# Patient Record
Sex: Male | Born: 1955 | Race: Black or African American | Hispanic: No | Marital: Single | Smoking: Never smoker
Health system: Southern US, Community
[De-identification: ages and names within clinical notes are randomized; demographics above are authoritative.]

## PROBLEM LIST (undated history)

## (undated) DIAGNOSIS — I10 Essential (primary) hypertension: Secondary | ICD-10-CM

## (undated) DIAGNOSIS — E119 Type 2 diabetes mellitus without complications: Secondary | ICD-10-CM

## (undated) HISTORY — DX: Type 2 diabetes mellitus without complications: E11.9

## (undated) HISTORY — DX: Essential (primary) hypertension: I10

---

## 1997-10-28 ENCOUNTER — Emergency Department (HOSPITAL_COMMUNITY): Admission: EM | Admit: 1997-10-28 | Discharge: 1997-10-28 | Payer: Self-pay | Admitting: Emergency Medicine

## 1997-12-25 ENCOUNTER — Encounter: Payer: Self-pay | Admitting: Emergency Medicine

## 1997-12-25 ENCOUNTER — Emergency Department (HOSPITAL_COMMUNITY): Admission: EM | Admit: 1997-12-25 | Discharge: 1997-12-25 | Payer: Self-pay | Admitting: Emergency Medicine

## 2000-10-29 ENCOUNTER — Inpatient Hospital Stay (HOSPITAL_COMMUNITY): Admission: EM | Admit: 2000-10-29 | Discharge: 2000-11-27 | Payer: Self-pay | Admitting: *Deleted

## 2000-10-30 ENCOUNTER — Encounter: Payer: Self-pay | Admitting: Neurological Surgery

## 2000-10-30 ENCOUNTER — Encounter: Payer: Self-pay | Admitting: Neurosurgery

## 2006-01-28 ENCOUNTER — Ambulatory Visit: Payer: Self-pay | Admitting: Hospitalist

## 2006-01-28 ENCOUNTER — Inpatient Hospital Stay (HOSPITAL_COMMUNITY): Admission: EM | Admit: 2006-01-28 | Discharge: 2006-01-29 | Payer: Self-pay | Admitting: Emergency Medicine

## 2006-01-28 ENCOUNTER — Encounter (INDEPENDENT_AMBULATORY_CARE_PROVIDER_SITE_OTHER): Payer: Self-pay | Admitting: Cardiology

## 2006-04-01 ENCOUNTER — Ambulatory Visit: Payer: Self-pay | Admitting: Cardiology

## 2006-04-15 ENCOUNTER — Ambulatory Visit: Payer: Self-pay

## 2008-03-17 ENCOUNTER — Emergency Department (HOSPITAL_COMMUNITY): Admission: EM | Admit: 2008-03-17 | Discharge: 2008-03-17 | Payer: Self-pay | Admitting: Emergency Medicine
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
## 2010-07-13 NOTE — Discharge Summary (Signed)
NAME:  [facility], [facility] NO.:  [id number]   MEDICAL RECORD NO.:  [id number]          PATIENT TYPE:  INP   LOCATION:  3714                         FACILITY:  [facility]   PHYSICIAN:  Duncan Dull, M.D.     DATE OF BIRTH:  05/07/1955   DATE OF ADMISSION:  01/28/2006  DATE OF DISCHARGE:  01/29/2006                               DISCHARGE SUMMARY   DISCHARGE DIAGNOSES:  1. Atypical chest pain secondary to cocaine use.  2. Essential hypertension.  3. Prerenal renal insufficiency, resolved.  4. Poor dentition.   DISCHARGE MEDICATIONS:  1. Stop hydrochlorothiazide.  2. Stop lisinopril.  3. Norvasc 5 mg p.o. daily.   DISPOSITION AND FOLLOWUP:  Mr. Bekker was discharged in stable condition  after having been ruled out for MI.  He did not have chest pain or  shortness of breath on discharge.  He will follow up with the physician  at his assisted living facility and see Dr. Diona Browner from [facility] on February 12, 2006 at 3 p.m. in order to plan for a  Cardiolite testing.  Cause of anemia may be investigated as an  outpatient and follow up on chest x-ray in 2-3 months for right rib  calcification.   PROCEDURES:  1. Chest x-ray on January 28, 2006 showed a nodular density over the      first right rib on the frontal exam, likely to be an area of costal      cartilage calcification.  A followup plain film should be obtained      in 2-3 months to confirm stability.  An opacity was found at the      medial right lung base relating to atelectasis.  2. EKG:  Normal sinus rhythm without ST or T wave changes.   ADMITTING HISTORY AND PHYSICAL:  The patient is a 55 year old African  American man with past medical history of hypertension and a closed head  injury in September of 2002, leaving him with disability, who presents  to the emergency department with complaints of chest pain.  On morning  of admission, the patient had sudden onset of stabbing left lateral  chest  wall pain associated with nausea and shortness of breath.  He  denied any diaphoresis, syncope, blurry vision, vomiting and sense of  impending doom.  The nurse at his assisted living facility administered  325 mg of aspirin and activated EMS.  The patient received nitroglycerin  en route, after which the pain resolved.  This was the first episode of  chest pain for our patient.  He has no known history of coronary artery  disease.  He denied drug use and review of systems was insignificant.   PHYSICAL EXAMINATION:  VITAL SIGNS:  Temperature 98.8, blood pressure  102/48, heart rate 82, respiratory rate 10, O2 SAT 94% on room air.  GENERAL:  In no acute distress.  HEENT:  PERRLA.  EOMI.  Oropharynx clear.  Poor dentition, caries.  NECK:  Supple.  No lymphadenopathies.  No thyromegaly.  RESPIRATORY:  Lungs clear to auscultation bilaterally with good air  movement.  CARDIOVASCULAR:  Regular  rate and rhythm.  No murmurs, rubs, or gallops.  Peripheral pulses 2+ bilaterally.  ABDOMEN:  Soft, obese, nontender and non-distended.  No palpable masses  or organomegaly.  EXTREMITIES:  No edema, cyanosis or clubbing.  NEUROLOGIC:  Nonfocal.   LABORATORY DATA:  Sodium 140, potassium 3.8, chloride 104, CO2 24, BUN  22, creatinine 1.7, glucose 106.  WBC 6.2, hemoglobin 12.1, platelets  315,000, hematocrit 35.6, MCV 81.4.  Cardiac enzymes negative x2.  D-  dimer is negative.  Urinary drug screen positive for cocaine.  Fasting  lipid profile:  Total cholesterol 158, triglycerides 400, HDL 53, LDL  25.  TSH 1.11, free T4 0.837.   HOSPITAL COURSE:  PROBLEM #1 - CHEST PAIN:  Mr. Luten probability of  coronary artery disease is low to intermediate, considering his risk  factors of being a 55 year old man with hypertension.  He was ruled out  for acute coronary syndrome by serial EKGs and serial cardiac enzymes  that were negative.  Chest x-ray did not show any findings that could  explain his chest  pain.  However, his urinary drug screen was positive  for cocaine, so it was determined that his chest pain was most likely  cocaine-induced.  He did not have further episodes during hospital  course and remained hemodynamically stable.   PROBLEM #2 - HYPERTENSION:  His blood pressure remained controlled.  His  home regimen of hydrochlorothiazide and lisinopril was resumed; however,  it will not be continued on an outpatient basis.  We rather started him  on Norvasc 5 mg p.o. daily, since he does not require followup of  electrolytes and renal function.  A beta blocker could not be chosen,  since he uses cocaine.  He has no end-organ damage from his  hypertension.   PROBLEM #3 - RENAL INSUFFICIENCY:  The patient's creatinine on admission  was 1.7 and felt likely to be prerenal, since the patient had a history  of poor p.o. intake prior to admission; this was confirmed by his  creatinine normalizing on day #2 of his hospital course.   PROBLEM #4 - POOR DENTITION:  The patient has multiple caries.  He is  presently scheduled for dental surgery in the near future.   DISCHARGE VITALS:  Temperature 97.8, heart rate 68, blood pressure  145/85, O2 saturation 99% on room air.   DISCHARGE LABORATORY DATA:  WBC 5.8, hemoglobin 11.7, hematocrit 34.4,  MCV 80.8, RDW 13.5, platelet count 273,000.      Olene Craven, M.D.  Electronically Signed      Duncan Dull, M.D.  Electronically Signed    MC/MEDQ  D:  01/29/2006  T:  01/29/2006  Job:  [id number]
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
## 2011-06-26 DIAGNOSIS — C61 Malignant neoplasm of prostate: Secondary | ICD-10-CM | POA: Diagnosis not present

## 2011-06-26 DIAGNOSIS — R3915 Urgency of urination: Secondary | ICD-10-CM | POA: Diagnosis not present

## 2011-06-26 DIAGNOSIS — R300 Dysuria: Secondary | ICD-10-CM | POA: Diagnosis not present

## 2011-07-16 DIAGNOSIS — I10 Essential (primary) hypertension: Secondary | ICD-10-CM | POA: Diagnosis not present

## 2011-07-16 DIAGNOSIS — E785 Hyperlipidemia, unspecified: Secondary | ICD-10-CM | POA: Diagnosis not present

## 2011-07-16 DIAGNOSIS — D3701 Neoplasm of uncertain behavior of lip: Secondary | ICD-10-CM | POA: Diagnosis not present

## 2011-07-16 DIAGNOSIS — M25569 Pain in unspecified knee: Secondary | ICD-10-CM | POA: Diagnosis not present

## 2011-07-16 DIAGNOSIS — C61 Malignant neoplasm of prostate: Secondary | ICD-10-CM | POA: Diagnosis not present

## 2011-07-16 DIAGNOSIS — Z8546 Personal history of malignant neoplasm of prostate: Secondary | ICD-10-CM | POA: Diagnosis not present

## 2011-07-16 DIAGNOSIS — D126 Benign neoplasm of colon, unspecified: Secondary | ICD-10-CM | POA: Diagnosis not present

## 2011-07-16 DIAGNOSIS — K116 Mucocele of salivary gland: Secondary | ICD-10-CM | POA: Diagnosis not present

## 2011-07-31 DIAGNOSIS — D3701 Neoplasm of uncertain behavior of lip: Secondary | ICD-10-CM | POA: Diagnosis not present

## 2011-07-31 DIAGNOSIS — K1370 Unspecified lesions of oral mucosa: Secondary | ICD-10-CM | POA: Diagnosis not present

## 2011-07-31 DIAGNOSIS — D1800 Hemangioma unspecified site: Secondary | ICD-10-CM | POA: Diagnosis not present

## 2011-08-21 DIAGNOSIS — I10 Essential (primary) hypertension: Secondary | ICD-10-CM | POA: Diagnosis not present

## 2011-08-21 DIAGNOSIS — E785 Hyperlipidemia, unspecified: Secondary | ICD-10-CM | POA: Diagnosis not present

## 2011-08-21 DIAGNOSIS — M1710 Unilateral primary osteoarthritis, unspecified knee: Secondary | ICD-10-CM | POA: Diagnosis not present

## 2011-08-22 DIAGNOSIS — C61 Malignant neoplasm of prostate: Secondary | ICD-10-CM | POA: Diagnosis not present

## 2012-02-13 DIAGNOSIS — M1710 Unilateral primary osteoarthritis, unspecified knee: Secondary | ICD-10-CM | POA: Diagnosis not present

## 2012-02-13 DIAGNOSIS — E669 Obesity, unspecified: Secondary | ICD-10-CM | POA: Diagnosis not present

## 2012-02-13 DIAGNOSIS — Z0000 Encounter for general adult medical examination without abnormal findings: Secondary | ICD-10-CM | POA: Diagnosis not present

## 2012-02-13 DIAGNOSIS — E785 Hyperlipidemia, unspecified: Secondary | ICD-10-CM | POA: Diagnosis not present

## 2012-02-13 DIAGNOSIS — R319 Hematuria, unspecified: Secondary | ICD-10-CM | POA: Diagnosis not present

## 2012-02-13 DIAGNOSIS — C61 Malignant neoplasm of prostate: Secondary | ICD-10-CM | POA: Diagnosis not present

## 2012-02-13 DIAGNOSIS — Z23 Encounter for immunization: Secondary | ICD-10-CM | POA: Diagnosis not present

## 2012-02-13 DIAGNOSIS — I10 Essential (primary) hypertension: Secondary | ICD-10-CM | POA: Diagnosis not present

## 2012-02-13 DIAGNOSIS — D126 Benign neoplasm of colon, unspecified: Secondary | ICD-10-CM | POA: Diagnosis not present

## 2012-02-14 DIAGNOSIS — C61 Malignant neoplasm of prostate: Secondary | ICD-10-CM | POA: Diagnosis not present

## 2012-02-14 DIAGNOSIS — R310 Gross hematuria: Secondary | ICD-10-CM | POA: Diagnosis not present

## 2012-04-02 DIAGNOSIS — C61 Malignant neoplasm of prostate: Secondary | ICD-10-CM | POA: Diagnosis not present

## 2012-04-02 DIAGNOSIS — R310 Gross hematuria: Secondary | ICD-10-CM | POA: Diagnosis not present

## 2012-11-24 DIAGNOSIS — E785 Hyperlipidemia, unspecified: Secondary | ICD-10-CM | POA: Diagnosis not present

## 2012-11-24 DIAGNOSIS — I10 Essential (primary) hypertension: Secondary | ICD-10-CM | POA: Diagnosis not present

## 2012-11-24 DIAGNOSIS — R635 Abnormal weight gain: Secondary | ICD-10-CM | POA: Diagnosis not present

## 2012-11-24 DIAGNOSIS — M1710 Unilateral primary osteoarthritis, unspecified knee: Secondary | ICD-10-CM | POA: Diagnosis not present

## 2014-01-04 DIAGNOSIS — R7301 Impaired fasting glucose: Secondary | ICD-10-CM | POA: Diagnosis not present

## 2014-01-04 DIAGNOSIS — M25569 Pain in unspecified knee: Secondary | ICD-10-CM | POA: Diagnosis not present

## 2014-01-04 DIAGNOSIS — Z23 Encounter for immunization: Secondary | ICD-10-CM | POA: Diagnosis not present

## 2014-01-04 DIAGNOSIS — E785 Hyperlipidemia, unspecified: Secondary | ICD-10-CM | POA: Diagnosis not present

## 2014-01-04 DIAGNOSIS — I10 Essential (primary) hypertension: Secondary | ICD-10-CM | POA: Diagnosis not present

## 2014-02-08 DIAGNOSIS — M25561 Pain in right knee: Secondary | ICD-10-CM | POA: Diagnosis not present

## 2014-02-08 DIAGNOSIS — I10 Essential (primary) hypertension: Secondary | ICD-10-CM | POA: Diagnosis not present

## 2014-02-15 DIAGNOSIS — M25561 Pain in right knee: Secondary | ICD-10-CM | POA: Diagnosis not present

## 2014-10-10 DIAGNOSIS — I10 Essential (primary) hypertension: Secondary | ICD-10-CM | POA: Diagnosis not present

## 2014-10-10 DIAGNOSIS — R7301 Impaired fasting glucose: Secondary | ICD-10-CM | POA: Diagnosis not present

## 2014-10-10 DIAGNOSIS — E785 Hyperlipidemia, unspecified: Secondary | ICD-10-CM | POA: Diagnosis not present

## 2015-05-03 DIAGNOSIS — I10 Essential (primary) hypertension: Secondary | ICD-10-CM | POA: Diagnosis not present

## 2015-05-03 DIAGNOSIS — E785 Hyperlipidemia, unspecified: Secondary | ICD-10-CM | POA: Diagnosis not present

## 2015-05-03 DIAGNOSIS — R7301 Impaired fasting glucose: Secondary | ICD-10-CM | POA: Diagnosis not present

## 2015-05-17 DIAGNOSIS — R945 Abnormal results of liver function studies: Secondary | ICD-10-CM | POA: Diagnosis not present

## 2015-11-03 DIAGNOSIS — Z0000 Encounter for general adult medical examination without abnormal findings: Secondary | ICD-10-CM | POA: Diagnosis not present

## 2015-11-03 DIAGNOSIS — C61 Malignant neoplasm of prostate: Secondary | ICD-10-CM | POA: Diagnosis not present

## 2015-11-03 DIAGNOSIS — E785 Hyperlipidemia, unspecified: Secondary | ICD-10-CM | POA: Diagnosis not present

## 2015-11-03 DIAGNOSIS — Z23 Encounter for immunization: Secondary | ICD-10-CM | POA: Diagnosis not present

## 2015-11-03 DIAGNOSIS — R7301 Impaired fasting glucose: Secondary | ICD-10-CM | POA: Diagnosis not present

## 2015-11-03 DIAGNOSIS — I10 Essential (primary) hypertension: Secondary | ICD-10-CM | POA: Diagnosis not present

## 2015-11-15 DIAGNOSIS — E119 Type 2 diabetes mellitus without complications: Secondary | ICD-10-CM | POA: Diagnosis not present

## 2015-11-15 DIAGNOSIS — H25813 Combined forms of age-related cataract, bilateral: Secondary | ICD-10-CM | POA: Diagnosis not present

## 2015-11-15 DIAGNOSIS — H52223 Regular astigmatism, bilateral: Secondary | ICD-10-CM | POA: Diagnosis not present

## 2015-11-15 DIAGNOSIS — H35033 Hypertensive retinopathy, bilateral: Secondary | ICD-10-CM | POA: Diagnosis not present

## 2015-11-15 DIAGNOSIS — H524 Presbyopia: Secondary | ICD-10-CM | POA: Diagnosis not present

## 2015-11-15 DIAGNOSIS — H5203 Hypermetropia, bilateral: Secondary | ICD-10-CM | POA: Diagnosis not present

## 2015-11-22 DIAGNOSIS — Z8546 Personal history of malignant neoplasm of prostate: Secondary | ICD-10-CM | POA: Diagnosis not present

## 2015-11-22 DIAGNOSIS — C61 Malignant neoplasm of prostate: Secondary | ICD-10-CM | POA: Diagnosis not present

## 2015-12-20 DIAGNOSIS — Z86010 Personal history of colonic polyps: Secondary | ICD-10-CM | POA: Diagnosis not present

## 2015-12-20 DIAGNOSIS — K635 Polyp of colon: Secondary | ICD-10-CM | POA: Diagnosis not present

## 2015-12-20 DIAGNOSIS — D127 Benign neoplasm of rectosigmoid junction: Secondary | ICD-10-CM | POA: Diagnosis not present

## 2015-12-20 DIAGNOSIS — D125 Benign neoplasm of sigmoid colon: Secondary | ICD-10-CM | POA: Diagnosis not present

## 2015-12-20 DIAGNOSIS — K640 First degree hemorrhoids: Secondary | ICD-10-CM | POA: Diagnosis not present

## 2015-12-20 DIAGNOSIS — D126 Benign neoplasm of colon, unspecified: Secondary | ICD-10-CM | POA: Diagnosis not present

## 2015-12-20 DIAGNOSIS — K621 Rectal polyp: Secondary | ICD-10-CM | POA: Diagnosis not present

## 2015-12-26 DIAGNOSIS — D126 Benign neoplasm of colon, unspecified: Secondary | ICD-10-CM | POA: Diagnosis not present

## 2015-12-26 DIAGNOSIS — K635 Polyp of colon: Secondary | ICD-10-CM | POA: Diagnosis not present

## 2016-05-02 DIAGNOSIS — R7301 Impaired fasting glucose: Secondary | ICD-10-CM | POA: Diagnosis not present

## 2016-05-02 DIAGNOSIS — I10 Essential (primary) hypertension: Secondary | ICD-10-CM | POA: Diagnosis not present

## 2016-05-02 DIAGNOSIS — E7800 Pure hypercholesterolemia, unspecified: Secondary | ICD-10-CM | POA: Diagnosis not present

## 2016-11-01 DIAGNOSIS — E119 Type 2 diabetes mellitus without complications: Secondary | ICD-10-CM | POA: Diagnosis not present

## 2016-11-01 DIAGNOSIS — Z7984 Long term (current) use of oral hypoglycemic drugs: Secondary | ICD-10-CM | POA: Diagnosis not present

## 2016-11-01 DIAGNOSIS — E7800 Pure hypercholesterolemia, unspecified: Secondary | ICD-10-CM | POA: Diagnosis not present

## 2016-11-01 DIAGNOSIS — I10 Essential (primary) hypertension: Secondary | ICD-10-CM | POA: Diagnosis not present

## 2016-11-26 ENCOUNTER — Encounter: Payer: Self-pay | Admitting: Dietician

## 2016-11-26 ENCOUNTER — Encounter: Payer: Medicare Other | Attending: Physician Assistant | Admitting: Dietician

## 2016-11-26 DIAGNOSIS — E119 Type 2 diabetes mellitus without complications: Secondary | ICD-10-CM | POA: Insufficient documentation

## 2016-11-26 DIAGNOSIS — Z713 Dietary counseling and surveillance: Secondary | ICD-10-CM | POA: Diagnosis not present

## 2016-11-26 NOTE — Progress Notes (Signed)

## 2016-12-03 ENCOUNTER — Encounter: Payer: Medicare Other | Admitting: Dietician

## 2016-12-03 DIAGNOSIS — E119 Type 2 diabetes mellitus without complications: Secondary | ICD-10-CM

## 2016-12-03 DIAGNOSIS — Z713 Dietary counseling and surveillance: Secondary | ICD-10-CM | POA: Diagnosis not present

## 2016-12-05 NOTE — Progress Notes (Signed)
Patient was seen on 12/03/16 for the second of a series of three diabetes self-management courses at the [facility]. The following learning objectives were met by the patient during this class:   Describe the role of different macronutrients on glucose  Explain how carbohydrates affect blood glucose  State what foods contain the most carbohydrates  Demonstrate carbohydrate counting  Demonstrate how to read Nutrition Facts food label  Describe effects of various fats on heart health  Describe the importance of good nutrition for health and healthy eating strategies  Describe techniques for managing your shopping, cooking and meal planning  List strategies to follow meal plan when dining out  Describe the effects of alcohol on glucose and how to use it safely  Goals:  Follow Diabetes Meal Plan as instructed  Aim to spread carbs evenly throughout the day  Aim for 3 meals per day and snacks as needed Include lean protein foods to meals/snacks  Monitor glucose levels as instructed by your doctor   Follow-Up Plan:  Attend Core 3  Work towards following your personal food plan.   

## 2016-12-10 ENCOUNTER — Encounter: Payer: Medicare Other | Admitting: Dietician

## 2016-12-10 DIAGNOSIS — E119 Type 2 diabetes mellitus without complications: Secondary | ICD-10-CM | POA: Diagnosis not present

## 2016-12-10 DIAGNOSIS — Z713 Dietary counseling and surveillance: Secondary | ICD-10-CM | POA: Diagnosis not present

## 2016-12-10 NOTE — Progress Notes (Signed)
Patient was seen on 12/10/16 for the third of a series of three diabetes self-management courses at the [facility].   [facility] the amount of activity recommended for healthy living . Describe activities suitable for individual needs . Identify ways to regularly incorporate activity into daily life . Identify barriers to activity and ways to over come these barriers  Identify diabetes medications being personally used and their primary action for lowering glucose and possible side effects . Describe role of stress on blood glucose and develop strategies to address psychosocial issues . Identify diabetes complications and ways to prevent them  Explain how to manage diabetes during illness . Evaluate success in meeting personal goal . Establish 2-3 goals that they will plan to diligently work on until they return for the  26-month follow-up visit  Goals:   I will be active 30 minutes or more 3 times a week  I will eat less unhealthy fats by eating less chips  Your patient has identified these potential barriers to change:  Motivation    Plan:  Attend Support Group as desired

## 2016-12-31 DIAGNOSIS — R748 Abnormal levels of other serum enzymes: Secondary | ICD-10-CM | POA: Diagnosis not present

## 2016-12-31 DIAGNOSIS — E1165 Type 2 diabetes mellitus with hyperglycemia: Secondary | ICD-10-CM | POA: Diagnosis not present

## 2017-06-30 DIAGNOSIS — E119 Type 2 diabetes mellitus without complications: Secondary | ICD-10-CM | POA: Diagnosis not present

## 2017-06-30 DIAGNOSIS — E785 Hyperlipidemia, unspecified: Secondary | ICD-10-CM | POA: Diagnosis not present

## 2017-06-30 DIAGNOSIS — I10 Essential (primary) hypertension: Secondary | ICD-10-CM | POA: Diagnosis not present

## 2017-08-04 ENCOUNTER — Other Ambulatory Visit: Payer: Self-pay

## 2017-08-04 ENCOUNTER — Emergency Department (HOSPITAL_COMMUNITY): Payer: Medicare Other

## 2017-08-04 ENCOUNTER — Encounter (HOSPITAL_COMMUNITY): Payer: Self-pay | Admitting: Emergency Medicine

## 2017-08-04 ENCOUNTER — Emergency Department (HOSPITAL_COMMUNITY)
Admission: EM | Admit: 2017-08-04 | Discharge: 2017-08-04 | Disposition: A | Discharge status: Home / Self Care | Payer: Medicare Other | Attending: Emergency Medicine | Admitting: Emergency Medicine

## 2017-08-04 DIAGNOSIS — Y939 Activity, unspecified: Secondary | ICD-10-CM | POA: Insufficient documentation

## 2017-08-04 DIAGNOSIS — E119 Type 2 diabetes mellitus without complications: Secondary | ICD-10-CM | POA: Diagnosis not present

## 2017-08-04 DIAGNOSIS — S82832A Other fracture of upper and lower end of left fibula, initial encounter for closed fracture: Secondary | ICD-10-CM

## 2017-08-04 DIAGNOSIS — Z7984 Long term (current) use of oral hypoglycemic drugs: Secondary | ICD-10-CM | POA: Diagnosis not present

## 2017-08-04 DIAGNOSIS — W19XXXA Unspecified fall, initial encounter: Secondary | ICD-10-CM | POA: Diagnosis not present

## 2017-08-04 DIAGNOSIS — Y999 Unspecified external cause status: Secondary | ICD-10-CM | POA: Diagnosis not present

## 2017-08-04 DIAGNOSIS — Z79899 Other long term (current) drug therapy: Secondary | ICD-10-CM | POA: Insufficient documentation

## 2017-08-04 DIAGNOSIS — I10 Essential (primary) hypertension: Secondary | ICD-10-CM | POA: Insufficient documentation

## 2017-08-04 DIAGNOSIS — Y929 Unspecified place or not applicable: Secondary | ICD-10-CM | POA: Insufficient documentation

## 2017-08-04 DIAGNOSIS — S82432A Displaced oblique fracture of shaft of left fibula, initial encounter for closed fracture: Secondary | ICD-10-CM | POA: Diagnosis not present

## 2017-08-04 DIAGNOSIS — S99912A Unspecified injury of left ankle, initial encounter: Secondary | ICD-10-CM | POA: Diagnosis present

## 2017-08-04 MED ORDER — NAPROXEN 500 MG PO TABS: 500.0000 mg | ORAL_TABLET | Freq: Two times a day (BID) | ORAL | 0 refills | Status: DC | PRN

## 2017-08-04 NOTE — Discharge Instructions (Signed)
It was my pleasure taking care of you today!   Please call the orthopedic doctor listed today to schedule a follow up appointment.  Rest, ice and elevating the leg are very important. This will help with pain and swelling.   Naproxen twice daily as needed for pain. You can also alternate with Tylenol over-the-counter for additional pain relief.   Use crutches as needed for comfort. If you feel able to walk on the foot WITH THE BOOT ON, then that is fine. You must have boot on when you walk around.   Return to ER for new or worsening symptoms, any additional concerns.

## 2017-08-04 NOTE — ED Triage Notes (Signed)
Pt. Stated, I tripped and turned my left ankle. Swelling and painful

## 2017-08-04 NOTE — Progress Notes (Signed)
Orthopedic Tech Progress Note Patient Details:  Nathaniel [facility] Feb 01, 1956 [id number]  Ortho Devices Type of Ortho Device: Crutches, CAM walker Ortho Device/Splint Location: LLE Ortho Device/Splint Interventions: Ordered, Application, Adjustment   Post Interventions Patient Tolerated: Well Instructions Provided: Care of device   [name] 08/04/2017, 10:22 AM

## 2017-08-04 NOTE — ED Notes (Signed)
Pt stable, ambulatory with crutches and cam boot, and verbalizes understanding of d/c instructions.

## 2017-08-04 NOTE — ED Provider Notes (Signed)
[facility] EMERGENCY DEPARTMENT Provider Note   CSN: [id number] Arrival date & time: 08/04/17  0759     History   Chief Complaint Chief Complaint  Patient presents with  . Ankle Pain    HPI Nathaniel Burns is a 62 y.o. male.  The history is provided by the patient and medical records. No language interpreter was used.  Ankle Pain   Pertinent negatives include no numbness.   Nathaniel Burns is a 62 y.o. male  with a PMH of DM, HTN who presents to the Emergency Department complaining of persistent, constant left ankle pain since he fell 2 days ago.  Patient states that he was drunk and lost his balance.  He does not really remember what happened and unsure of the exact mechanism of the injury.  He has been able to ambulate on the foot, but this does exacerbate his pain.  He endorses associated swelling to ankle and foot which is been getting worse over the last 2 days.  He has been taking Tylenol at home with some relief.  Denies any numbness, tingling or weakness.  Past Medical History:  Diagnosis Date  . Diabetes mellitus without complication ([place])   . Hypertension     There are no active problems to display for this patient.   History reviewed. No pertinent surgical history.      Home Medications    Prior to Admission medications   Medication Sig Start Date End Date Taking? Authorizing Provider  amLODipine (NORVASC) 10 MG tablet Take 10 mg by mouth daily.    [provider]  atorvastatin (LIPITOR) 20 MG tablet Take 20 mg by mouth daily.    [provider]  lisinopril-hydrochlorothiazide (PRINZIDE,ZESTORETIC) 20-12.5 MG tablet Take 1 tablet by mouth daily.    [provider]  metFORMIN (GLUCOPHAGE) 500 MG tablet Take by mouth daily with breakfast.    [provider]  naproxen (NAPROSYN) 500 MG tablet Take 1 tablet (500 mg total) by mouth 2 (two) times daily as needed. 08/04/17   Ward, Ozella Almond, PA-C     Family History No family history on file.  Social History Social History   Tobacco Use  . Smoking status: Never Smoker  . Smokeless tobacco: Never Used  Substance Use Topics  . Alcohol use: Yes  . Drug use: Not Currently     Allergies   Patient has no allergy information on record.   Review of Systems Review of Systems  Musculoskeletal: Positive for arthralgias and myalgias.  Skin: Negative for color change and wound.  Neurological: Negative for weakness and numbness.     Physical Exam Updated Vital Signs BP 128/60 (BP Location: Left Arm)   Pulse 67   Temp 98.3 F (36.8 C) (Oral)   Resp 16   Ht 5\' 11"  (1.803 m)   Wt (!) 138.3 kg (305 lb)   SpO2 99%   BMI 42.54 kg/m   Physical Exam  Constitutional: He appears well-developed and well-nourished. No distress.  HENT:  Head: Normocephalic and atraumatic.  Neck: Neck supple.  Cardiovascular: Normal rate, regular rhythm and normal heart sounds.  No murmur heard. Pulmonary/Chest: Effort normal and breath sounds normal. No respiratory distress. He has no wheezes. He has no rales.  Musculoskeletal:  Tenderness to palpation of left lateral malleolus.  He has swelling to the forefoot, however no pain or tenderness to palpation. 2+ DP.  Sensation intact.  Good cap refill.  Full range of motion to foot/ankle/knee.  No tenderness to palpation of the knee.  Neurological: He is alert.  Skin: Skin is warm and dry.  Nursing note and vitals reviewed.    ED Treatments / Results  Labs (all labs ordered are listed, but only abnormal results are displayed) Labs Reviewed - No data to display  EKG None  Radiology Dg Ankle Complete Left  Result Date: 08/04/2017 CLINICAL DATA:  62 year old male with a history of fall and left ankle injury on Friday EXAM: LEFT ANKLE COMPLETE - 3+ VIEW COMPARISON:  None. FINDINGS: Acute oblique fracture at the distal left fibula, at the level of the syndesmosis and ankle mortise. Associated  soft tissue swelling and joint effusion. Ankle mortise appears congruent. Degenerative changes at the ankle. IMPRESSION: Acute oblique fibular fracture, as above. Associated soft tissue swelling and joint effusion. Electronically Signed   By: Corrie Mckusick D.O.   On: 08/04/2017 08:55    Procedures Procedures (including critical care time)  Medications Ordered in ED Medications - No data to display   Initial Impression / Assessment and Plan / ED Course  I have reviewed the triage vital signs and the nursing notes.  Pertinent labs & imaging results that were available during my care of the patient were reviewed by me and considered in my medical decision making (see chart for details).    Nathaniel Burns is a 62 y.o. male who presents to ED for evaluation of left lateral ankle pain after mechanical fall 2 days ago.  Neurovascularly intact on exam.  Tenderness to palpation of the ankle.  No tenderness to foot or knee.  X-ray reviewed showing distal fibula fracture.  No open wounds to suggest open fracture.  Placed in cam walker and will have him follow-up with orthopedics.  Symptomatic home care instructions and return precautions were discussed.  All questions answered.  Patient discussed with Dr. Lacinda Axon who agrees with treatment plan.    Final Clinical Impressions(s) / ED Diagnoses   Final diagnoses:  Closed fracture of distal end of left fibula, unspecified fracture morphology, initial encounter    ED Discharge Orders        Ordered    naproxen (NAPROSYN) 500 MG tablet  2 times daily PRN     08/04/17 0921       Ward, Ozella Almond, PA-C 08/04/17 3094    Nat Christen, MD 08/06/17 1034

## 2017-08-04 NOTE — ED Notes (Signed)
Pt has swelling to left ankle and foot- states he fell or twisted it on Friday- but "I was drunk and don't really remember" good cap refill, positive pedal pulse by doppler.

## 2017-08-04 NOTE — ED Notes (Signed)
Ortho tech at bedside 

## 2017-08-14 DIAGNOSIS — M25572 Pain in left ankle and joints of left foot: Secondary | ICD-10-CM | POA: Diagnosis not present

## 2017-08-25 DIAGNOSIS — M25572 Pain in left ankle and joints of left foot: Secondary | ICD-10-CM | POA: Diagnosis not present

## 2017-09-03 DIAGNOSIS — S82832D Other fracture of upper and lower end of left fibula, subsequent encounter for closed fracture with routine healing: Secondary | ICD-10-CM | POA: Diagnosis not present

## 2017-09-03 DIAGNOSIS — M25572 Pain in left ankle and joints of left foot: Secondary | ICD-10-CM | POA: Diagnosis not present

## 2017-10-01 DIAGNOSIS — S82832D Other fracture of upper and lower end of left fibula, subsequent encounter for closed fracture with routine healing: Secondary | ICD-10-CM | POA: Diagnosis not present

## 2017-10-01 DIAGNOSIS — M25572 Pain in left ankle and joints of left foot: Secondary | ICD-10-CM | POA: Diagnosis not present

## 2017-11-12 DIAGNOSIS — S82832D Other fracture of upper and lower end of left fibula, subsequent encounter for closed fracture with routine healing: Secondary | ICD-10-CM | POA: Diagnosis not present

## 2017-11-12 DIAGNOSIS — M25572 Pain in left ankle and joints of left foot: Secondary | ICD-10-CM | POA: Diagnosis not present

## 2017-12-30 DIAGNOSIS — E785 Hyperlipidemia, unspecified: Secondary | ICD-10-CM | POA: Diagnosis not present

## 2017-12-30 DIAGNOSIS — I10 Essential (primary) hypertension: Secondary | ICD-10-CM | POA: Diagnosis not present

## 2017-12-30 DIAGNOSIS — B351 Tinea unguium: Secondary | ICD-10-CM | POA: Diagnosis not present

## 2017-12-30 DIAGNOSIS — Z0000 Encounter for general adult medical examination without abnormal findings: Secondary | ICD-10-CM | POA: Diagnosis not present

## 2017-12-30 DIAGNOSIS — E1140 Type 2 diabetes mellitus with diabetic neuropathy, unspecified: Secondary | ICD-10-CM | POA: Diagnosis not present

## 2017-12-30 DIAGNOSIS — E1169 Type 2 diabetes mellitus with other specified complication: Secondary | ICD-10-CM | POA: Diagnosis not present

## 2017-12-30 DIAGNOSIS — Z8546 Personal history of malignant neoplasm of prostate: Secondary | ICD-10-CM | POA: Diagnosis not present

## 2018-07-01 DIAGNOSIS — I10 Essential (primary) hypertension: Secondary | ICD-10-CM | POA: Diagnosis not present

## 2018-07-01 DIAGNOSIS — E7800 Pure hypercholesterolemia, unspecified: Secondary | ICD-10-CM | POA: Diagnosis not present

## 2018-07-01 DIAGNOSIS — E1140 Type 2 diabetes mellitus with diabetic neuropathy, unspecified: Secondary | ICD-10-CM | POA: Diagnosis not present

## 2018-08-18 DIAGNOSIS — C61 Malignant neoplasm of prostate: Secondary | ICD-10-CM | POA: Diagnosis not present

## 2018-09-08 DIAGNOSIS — I10 Essential (primary) hypertension: Secondary | ICD-10-CM | POA: Diagnosis not present

## 2018-09-08 DIAGNOSIS — E7800 Pure hypercholesterolemia, unspecified: Secondary | ICD-10-CM | POA: Diagnosis not present

## 2018-09-08 DIAGNOSIS — E1140 Type 2 diabetes mellitus with diabetic neuropathy, unspecified: Secondary | ICD-10-CM | POA: Diagnosis not present

## 2019-01-09 ENCOUNTER — Other Ambulatory Visit: Payer: Self-pay

## 2019-01-09 DIAGNOSIS — Z20822 Contact with and (suspected) exposure to covid-19: Secondary | ICD-10-CM

## 2019-01-09 DIAGNOSIS — Z20828 Contact with and (suspected) exposure to other viral communicable diseases: Secondary | ICD-10-CM

## 2019-01-12 LAB — NOVEL CORONAVIRUS, NAA: SARS-CoV-2, NAA: NOT DETECTED

## 2019-01-18 DIAGNOSIS — I10 Essential (primary) hypertension: Secondary | ICD-10-CM | POA: Diagnosis not present

## 2019-01-18 DIAGNOSIS — E1140 Type 2 diabetes mellitus with diabetic neuropathy, unspecified: Secondary | ICD-10-CM | POA: Diagnosis not present

## 2019-01-18 DIAGNOSIS — Z0000 Encounter for general adult medical examination without abnormal findings: Secondary | ICD-10-CM | POA: Diagnosis not present

## 2019-01-18 DIAGNOSIS — Z125 Encounter for screening for malignant neoplasm of prostate: Secondary | ICD-10-CM | POA: Diagnosis not present

## 2019-01-18 DIAGNOSIS — Z131 Encounter for screening for diabetes mellitus: Secondary | ICD-10-CM | POA: Diagnosis not present

## 2019-01-18 DIAGNOSIS — Z1389 Encounter for screening for other disorder: Secondary | ICD-10-CM | POA: Diagnosis not present

## 2019-01-18 DIAGNOSIS — Z13220 Encounter for screening for lipoid disorders: Secondary | ICD-10-CM | POA: Diagnosis not present

## 2019-03-23 DIAGNOSIS — E1140 Type 2 diabetes mellitus with diabetic neuropathy, unspecified: Secondary | ICD-10-CM | POA: Diagnosis not present

## 2019-03-23 DIAGNOSIS — Z136 Encounter for screening for cardiovascular disorders: Secondary | ICD-10-CM | POA: Diagnosis not present

## 2019-03-23 DIAGNOSIS — Z13220 Encounter for screening for lipoid disorders: Secondary | ICD-10-CM | POA: Diagnosis not present

## 2019-03-23 DIAGNOSIS — Z125 Encounter for screening for malignant neoplasm of prostate: Secondary | ICD-10-CM | POA: Diagnosis not present

## 2019-03-23 DIAGNOSIS — I10 Essential (primary) hypertension: Secondary | ICD-10-CM | POA: Diagnosis not present

## 2019-06-23 DIAGNOSIS — M25571 Pain in right ankle and joints of right foot: Secondary | ICD-10-CM | POA: Diagnosis not present

## 2019-06-25 DIAGNOSIS — M25571 Pain in right ankle and joints of right foot: Secondary | ICD-10-CM | POA: Diagnosis not present

## 2019-08-04 DIAGNOSIS — I10 Essential (primary) hypertension: Secondary | ICD-10-CM | POA: Diagnosis not present

## 2019-08-04 DIAGNOSIS — E1169 Type 2 diabetes mellitus with other specified complication: Secondary | ICD-10-CM | POA: Diagnosis not present

## 2019-08-04 DIAGNOSIS — E785 Hyperlipidemia, unspecified: Secondary | ICD-10-CM | POA: Diagnosis not present

## 2019-08-13 ENCOUNTER — Other Ambulatory Visit: Payer: Self-pay | Admitting: Physician Assistant

## 2019-08-13 DIAGNOSIS — R748 Abnormal levels of other serum enzymes: Secondary | ICD-10-CM

## 2019-08-18 ENCOUNTER — Ambulatory Visit
Admission: RE | Admit: 2019-08-18 | Discharge: 2019-08-18 | Disposition: A | Discharge status: Home / Self Care | Payer: Medicare Other | Source: Ambulatory Visit | Attending: Physician Assistant | Admitting: Physician Assistant

## 2019-08-18 DIAGNOSIS — R748 Abnormal levels of other serum enzymes: Secondary | ICD-10-CM

## 2019-09-15 IMAGING — CR DG ANKLE COMPLETE 3+V*L*
3 series · 3 of 3 positions shown · non-contrast
Comparison: None.

CLINICAL DATA: 61-year-old male with a history of fall and left
ankle injury on [REDACTED]

EXAM:
LEFT ANKLE COMPLETE - 3+ VIEW

[ankle ap]
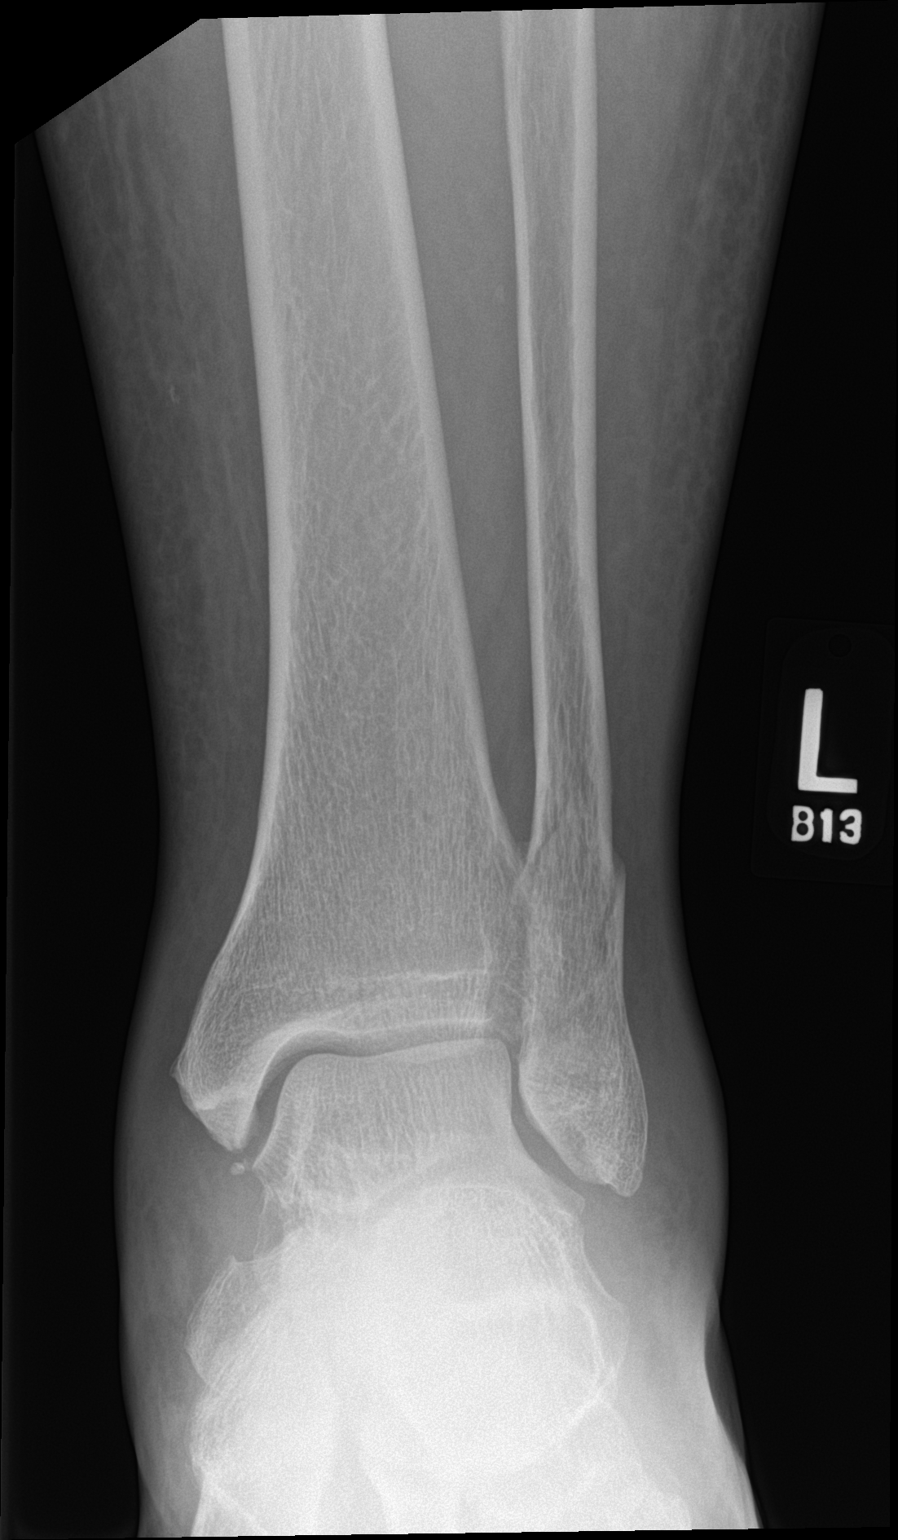

[ankle obl]
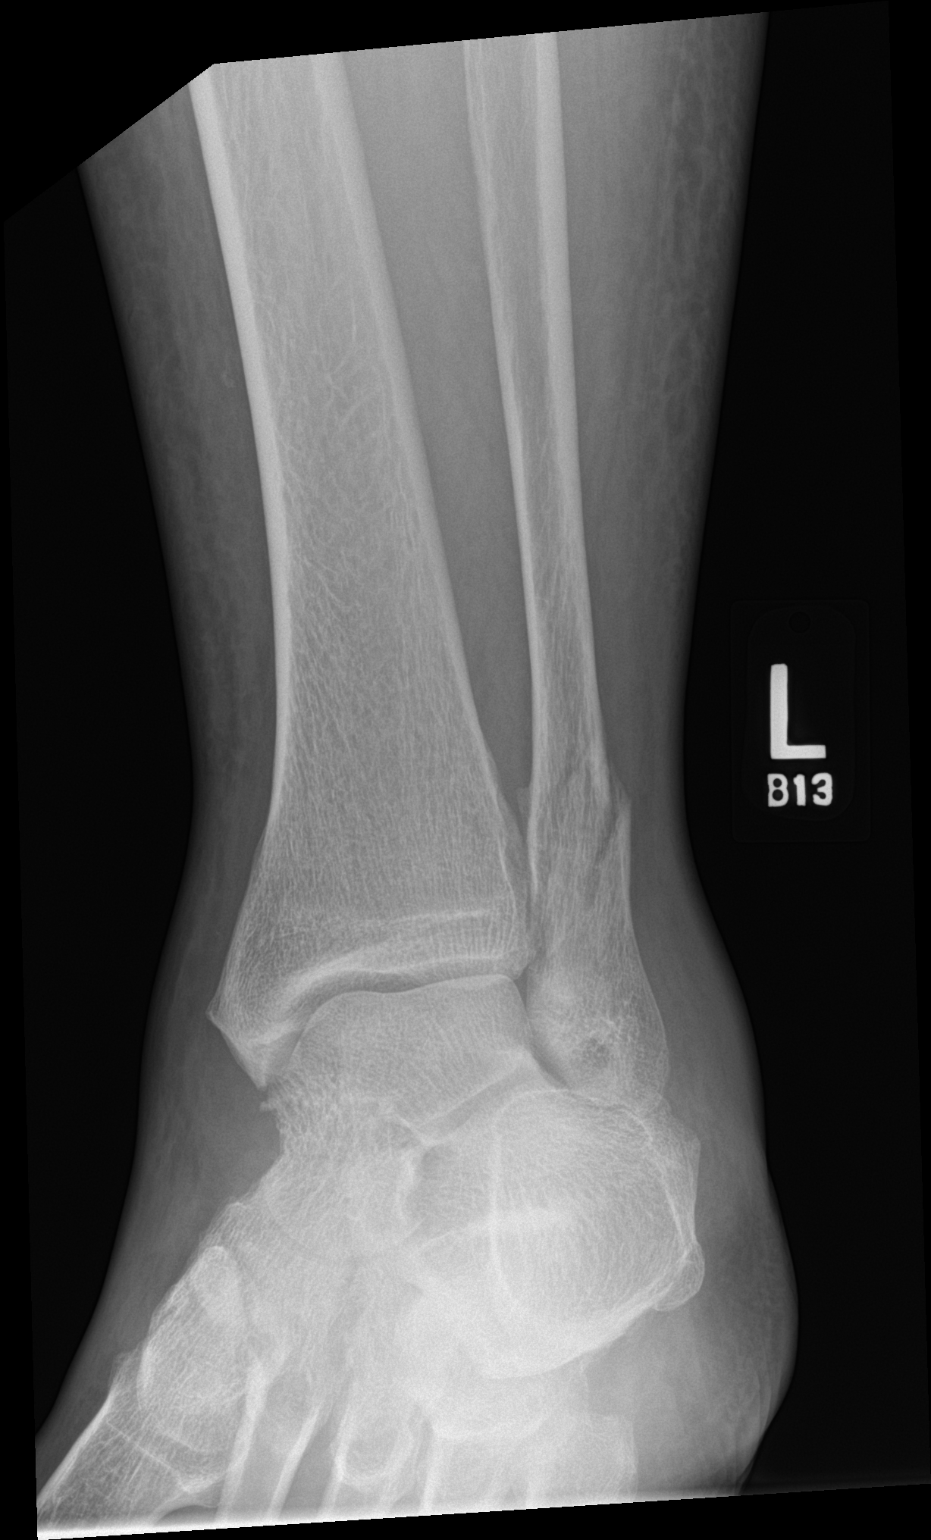

[ankle lat]
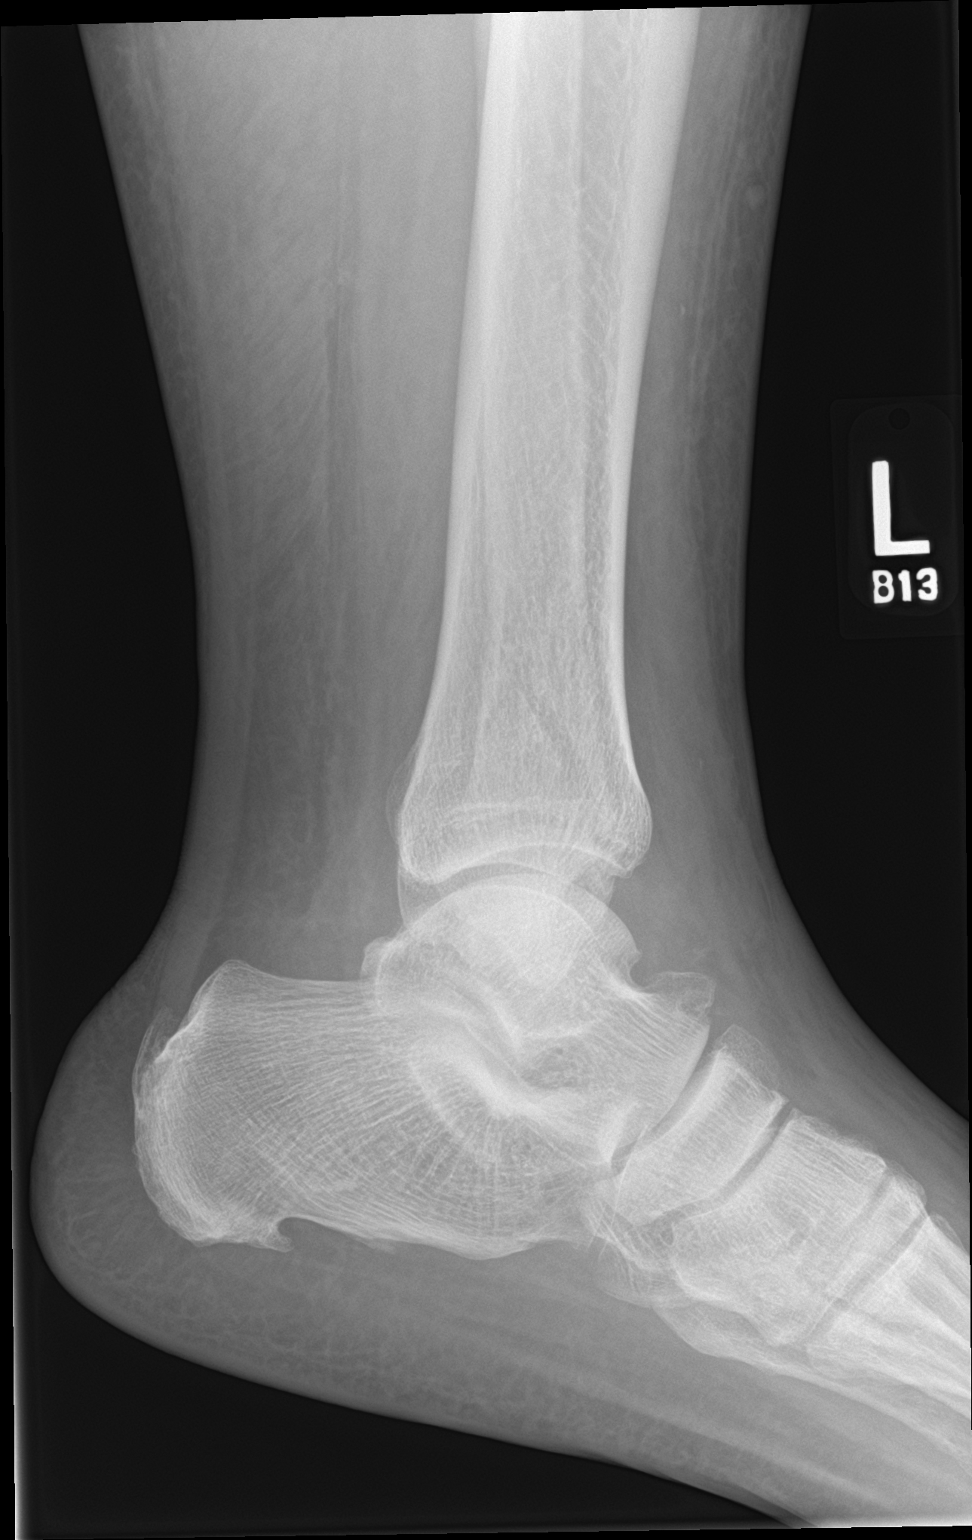

[3 of 3 positions shown; findings below may reference images not displayed]

FINDINGS: Acute oblique fracture at the distal left fibula, at the level of
the syndesmosis and ankle mortise. Associated soft tissue swelling
and joint effusion. Ankle mortise appears congruent.

Degenerative changes at the ankle.
IMPRESSION: Acute oblique fibular fracture, as above. Associated soft tissue
swelling and joint effusion.

## 2020-02-03 DIAGNOSIS — I10 Essential (primary) hypertension: Secondary | ICD-10-CM | POA: Diagnosis not present

## 2020-02-03 DIAGNOSIS — H35033 Hypertensive retinopathy, bilateral: Secondary | ICD-10-CM | POA: Diagnosis not present

## 2020-02-03 DIAGNOSIS — E785 Hyperlipidemia, unspecified: Secondary | ICD-10-CM | POA: Diagnosis not present

## 2020-02-03 DIAGNOSIS — C61 Malignant neoplasm of prostate: Secondary | ICD-10-CM | POA: Diagnosis not present

## 2020-02-03 DIAGNOSIS — K760 Fatty (change of) liver, not elsewhere classified: Secondary | ICD-10-CM | POA: Diagnosis not present

## 2020-02-03 DIAGNOSIS — E1140 Type 2 diabetes mellitus with diabetic neuropathy, unspecified: Secondary | ICD-10-CM | POA: Diagnosis not present

## 2020-02-03 DIAGNOSIS — Z23 Encounter for immunization: Secondary | ICD-10-CM | POA: Diagnosis not present

## 2020-02-03 DIAGNOSIS — Z0000 Encounter for general adult medical examination without abnormal findings: Secondary | ICD-10-CM | POA: Diagnosis not present

## 2020-02-16 DIAGNOSIS — R768 Other specified abnormal immunological findings in serum: Secondary | ICD-10-CM | POA: Diagnosis not present

## 2020-03-10 DIAGNOSIS — R748 Abnormal levels of other serum enzymes: Secondary | ICD-10-CM | POA: Diagnosis not present

## 2020-03-10 DIAGNOSIS — K760 Fatty (change of) liver, not elsewhere classified: Secondary | ICD-10-CM | POA: Diagnosis not present

## 2020-03-10 DIAGNOSIS — B182 Chronic viral hepatitis C: Secondary | ICD-10-CM | POA: Diagnosis not present

## 2020-03-24 ENCOUNTER — Other Ambulatory Visit: Payer: Self-pay | Admitting: Nurse Practitioner

## 2020-03-24 DIAGNOSIS — K7469 Other cirrhosis of liver: Secondary | ICD-10-CM

## 2020-03-24 DIAGNOSIS — R772 Abnormality of alphafetoprotein: Secondary | ICD-10-CM

## 2020-04-19 ENCOUNTER — Ambulatory Visit
Admission: RE | Admit: 2020-04-19 | Discharge: 2020-04-19 | Disposition: A | Discharge status: Home / Self Care | Payer: Medicare Other | Source: Ambulatory Visit | Attending: Nurse Practitioner | Admitting: Nurse Practitioner

## 2020-04-19 DIAGNOSIS — K8020 Calculus of gallbladder without cholecystitis without obstruction: Secondary | ICD-10-CM | POA: Diagnosis not present

## 2020-04-19 DIAGNOSIS — R160 Hepatomegaly, not elsewhere classified: Secondary | ICD-10-CM | POA: Diagnosis not present

## 2020-04-19 DIAGNOSIS — R7989 Other specified abnormal findings of blood chemistry: Secondary | ICD-10-CM | POA: Diagnosis not present

## 2020-04-19 DIAGNOSIS — R935 Abnormal findings on diagnostic imaging of other abdominal regions, including retroperitoneum: Secondary | ICD-10-CM | POA: Diagnosis not present

## 2020-04-19 DIAGNOSIS — K7469 Other cirrhosis of liver: Secondary | ICD-10-CM

## 2020-04-19 DIAGNOSIS — R772 Abnormality of alphafetoprotein: Secondary | ICD-10-CM

## 2020-04-19 MED ORDER — GADOXETATE DISODIUM 0.25 MMOL/ML IV SOLN
10.0000 mL | Freq: Once | INTRAVENOUS | Status: AC | PRN
  Administered 2020-04-19: 10 mL via INTRAVENOUS

## 2020-05-04 DIAGNOSIS — K7402 Hepatic fibrosis, advanced fibrosis: Secondary | ICD-10-CM | POA: Diagnosis not present

## 2020-05-04 DIAGNOSIS — B182 Chronic viral hepatitis C: Secondary | ICD-10-CM | POA: Diagnosis not present

## 2020-05-04 DIAGNOSIS — R772 Abnormality of alphafetoprotein: Secondary | ICD-10-CM | POA: Diagnosis not present

## 2020-05-22 DIAGNOSIS — S9031XA Contusion of right foot, initial encounter: Secondary | ICD-10-CM | POA: Diagnosis not present

## 2020-06-06 DIAGNOSIS — B182 Chronic viral hepatitis C: Secondary | ICD-10-CM | POA: Diagnosis not present

## 2020-06-06 DIAGNOSIS — K760 Fatty (change of) liver, not elsewhere classified: Secondary | ICD-10-CM | POA: Diagnosis not present

## 2020-08-01 DIAGNOSIS — K760 Fatty (change of) liver, not elsewhere classified: Secondary | ICD-10-CM | POA: Diagnosis not present

## 2020-08-01 DIAGNOSIS — B182 Chronic viral hepatitis C: Secondary | ICD-10-CM | POA: Diagnosis not present

## 2020-08-03 DIAGNOSIS — E1169 Type 2 diabetes mellitus with other specified complication: Secondary | ICD-10-CM | POA: Diagnosis not present

## 2020-08-03 DIAGNOSIS — I10 Essential (primary) hypertension: Secondary | ICD-10-CM | POA: Diagnosis not present

## 2020-08-03 DIAGNOSIS — E7800 Pure hypercholesterolemia, unspecified: Secondary | ICD-10-CM | POA: Diagnosis not present

## 2020-08-03 DIAGNOSIS — B182 Chronic viral hepatitis C: Secondary | ICD-10-CM | POA: Diagnosis not present

## 2020-08-17 DIAGNOSIS — R772 Abnormality of alphafetoprotein: Secondary | ICD-10-CM | POA: Diagnosis not present

## 2020-08-17 DIAGNOSIS — B182 Chronic viral hepatitis C: Secondary | ICD-10-CM | POA: Diagnosis not present

## 2020-08-17 DIAGNOSIS — K7402 Hepatic fibrosis, advanced fibrosis: Secondary | ICD-10-CM | POA: Diagnosis not present

## 2020-09-18 DIAGNOSIS — C61 Malignant neoplasm of prostate: Secondary | ICD-10-CM | POA: Diagnosis not present

## 2020-11-14 DIAGNOSIS — R760 Raised antibody titer: Secondary | ICD-10-CM | POA: Diagnosis not present

## 2020-11-14 DIAGNOSIS — B182 Chronic viral hepatitis C: Secondary | ICD-10-CM | POA: Diagnosis not present

## 2020-12-06 DIAGNOSIS — R772 Abnormality of alphafetoprotein: Secondary | ICD-10-CM | POA: Diagnosis not present

## 2020-12-06 DIAGNOSIS — B182 Chronic viral hepatitis C: Secondary | ICD-10-CM | POA: Diagnosis not present

## 2020-12-06 DIAGNOSIS — K7402 Hepatic fibrosis, advanced fibrosis: Secondary | ICD-10-CM | POA: Diagnosis not present

## 2020-12-06 DIAGNOSIS — E785 Hyperlipidemia, unspecified: Secondary | ICD-10-CM | POA: Diagnosis not present

## 2020-12-07 ENCOUNTER — Other Ambulatory Visit: Payer: Self-pay | Admitting: Nurse Practitioner

## 2020-12-07 DIAGNOSIS — B182 Chronic viral hepatitis C: Secondary | ICD-10-CM

## 2020-12-07 DIAGNOSIS — R772 Abnormality of alphafetoprotein: Secondary | ICD-10-CM

## 2020-12-07 DIAGNOSIS — K7402 Hepatic fibrosis, advanced fibrosis: Secondary | ICD-10-CM

## 2021-01-02 ENCOUNTER — Ambulatory Visit
Admission: RE | Admit: 2021-01-02 | Discharge: 2021-01-02 | Disposition: A | Discharge status: Home / Self Care | Payer: Medicare Other | Source: Ambulatory Visit | Attending: Nurse Practitioner | Admitting: Nurse Practitioner

## 2021-01-02 DIAGNOSIS — K7402 Hepatic fibrosis, advanced fibrosis: Secondary | ICD-10-CM

## 2021-01-02 DIAGNOSIS — B182 Chronic viral hepatitis C: Secondary | ICD-10-CM

## 2021-01-02 DIAGNOSIS — R772 Abnormality of alphafetoprotein: Secondary | ICD-10-CM

## 2021-01-02 DIAGNOSIS — K760 Fatty (change of) liver, not elsewhere classified: Secondary | ICD-10-CM | POA: Diagnosis not present

## 2021-01-02 DIAGNOSIS — K8020 Calculus of gallbladder without cholecystitis without obstruction: Secondary | ICD-10-CM | POA: Diagnosis not present

## 2021-02-06 DIAGNOSIS — E1165 Type 2 diabetes mellitus with hyperglycemia: Secondary | ICD-10-CM | POA: Diagnosis not present

## 2021-02-06 DIAGNOSIS — C61 Malignant neoplasm of prostate: Secondary | ICD-10-CM | POA: Diagnosis not present

## 2021-02-06 DIAGNOSIS — B182 Chronic viral hepatitis C: Secondary | ICD-10-CM | POA: Diagnosis not present

## 2021-02-06 DIAGNOSIS — E785 Hyperlipidemia, unspecified: Secondary | ICD-10-CM | POA: Diagnosis not present

## 2021-02-06 DIAGNOSIS — Z1211 Encounter for screening for malignant neoplasm of colon: Secondary | ICD-10-CM | POA: Diagnosis not present

## 2021-02-06 DIAGNOSIS — K760 Fatty (change of) liver, not elsewhere classified: Secondary | ICD-10-CM | POA: Diagnosis not present

## 2021-02-06 DIAGNOSIS — I10 Essential (primary) hypertension: Secondary | ICD-10-CM | POA: Diagnosis not present

## 2021-02-06 DIAGNOSIS — Z23 Encounter for immunization: Secondary | ICD-10-CM | POA: Diagnosis not present

## 2021-02-06 DIAGNOSIS — E7800 Pure hypercholesterolemia, unspecified: Secondary | ICD-10-CM | POA: Diagnosis not present

## 2021-02-06 DIAGNOSIS — Z0000 Encounter for general adult medical examination without abnormal findings: Secondary | ICD-10-CM | POA: Diagnosis not present

## 2021-03-30 DIAGNOSIS — K7030 Alcoholic cirrhosis of liver without ascites: Secondary | ICD-10-CM | POA: Diagnosis not present

## 2021-03-30 DIAGNOSIS — Z86010 Personal history of colonic polyps: Secondary | ICD-10-CM | POA: Diagnosis not present

## 2021-05-22 ENCOUNTER — Other Ambulatory Visit: Payer: Self-pay | Admitting: Gastroenterology

## 2021-06-11 ENCOUNTER — Other Ambulatory Visit: Payer: Self-pay | Admitting: Nurse Practitioner

## 2021-06-11 DIAGNOSIS — K7469 Other cirrhosis of liver: Secondary | ICD-10-CM

## 2021-06-11 DIAGNOSIS — R772 Abnormality of alphafetoprotein: Secondary | ICD-10-CM | POA: Diagnosis not present

## 2021-06-11 DIAGNOSIS — M79672 Pain in left foot: Secondary | ICD-10-CM | POA: Diagnosis not present

## 2021-06-14 DIAGNOSIS — M79672 Pain in left foot: Secondary | ICD-10-CM | POA: Diagnosis not present

## 2021-06-14 DIAGNOSIS — M25572 Pain in left ankle and joints of left foot: Secondary | ICD-10-CM | POA: Diagnosis not present

## 2021-06-19 ENCOUNTER — Ambulatory Visit
Admission: RE | Admit: 2021-06-19 | Discharge: 2021-06-19 | Disposition: A | Discharge status: Home / Self Care | Payer: Medicare Other | Source: Ambulatory Visit | Attending: Nurse Practitioner | Admitting: Nurse Practitioner

## 2021-06-19 DIAGNOSIS — K7460 Unspecified cirrhosis of liver: Secondary | ICD-10-CM | POA: Diagnosis not present

## 2021-06-19 DIAGNOSIS — K8020 Calculus of gallbladder without cholecystitis without obstruction: Secondary | ICD-10-CM | POA: Diagnosis not present

## 2021-06-19 DIAGNOSIS — R772 Abnormality of alphafetoprotein: Secondary | ICD-10-CM

## 2021-06-19 DIAGNOSIS — K7469 Other cirrhosis of liver: Secondary | ICD-10-CM

## 2021-07-05 DIAGNOSIS — M2142 Flat foot [pes planus] (acquired), left foot: Secondary | ICD-10-CM | POA: Diagnosis not present

## 2021-07-05 DIAGNOSIS — R29898 Other symptoms and signs involving the musculoskeletal system: Secondary | ICD-10-CM | POA: Diagnosis not present

## 2021-07-05 DIAGNOSIS — M722 Plantar fascial fibromatosis: Secondary | ICD-10-CM | POA: Diagnosis not present

## 2021-08-07 DIAGNOSIS — E785 Hyperlipidemia, unspecified: Secondary | ICD-10-CM | POA: Diagnosis not present

## 2021-08-07 DIAGNOSIS — I10 Essential (primary) hypertension: Secondary | ICD-10-CM | POA: Diagnosis not present

## 2021-08-07 DIAGNOSIS — L602 Onychogryphosis: Secondary | ICD-10-CM | POA: Diagnosis not present

## 2021-08-07 DIAGNOSIS — E1169 Type 2 diabetes mellitus with other specified complication: Secondary | ICD-10-CM | POA: Diagnosis not present

## 2021-09-06 ENCOUNTER — Ambulatory Visit (INDEPENDENT_AMBULATORY_CARE_PROVIDER_SITE_OTHER): Payer: Medicare Other | Admitting: Podiatry

## 2021-09-06 DIAGNOSIS — B353 Tinea pedis: Secondary | ICD-10-CM | POA: Diagnosis not present

## 2021-09-06 DIAGNOSIS — B351 Tinea unguium: Secondary | ICD-10-CM | POA: Diagnosis not present

## 2021-09-06 DIAGNOSIS — M79674 Pain in right toe(s): Secondary | ICD-10-CM

## 2021-09-06 DIAGNOSIS — M79675 Pain in left toe(s): Secondary | ICD-10-CM

## 2021-09-06 MED ORDER — KETOCONAZOLE 2 % EX CREA: 1.0000 | TOPICAL_CREAM | Freq: Two times a day (BID) | CUTANEOUS | 2 refills | Status: AC

## 2021-09-06 NOTE — Progress Notes (Signed)
  Subjective:  Patient ID: Nathaniel Burns, male    DOB: Oct 04, 1955,  MRN: [id number]  Chief Complaint  Patient presents with   Callouses    Calluses and toe nail trim     66 y.o. male presents with the above complaint. History confirmed with patient.  His nails are the primary issue and they are thickened and elongated.  Also has quite dry skin on both feet.  He states he is nondiabetic and does not have medication for this currently  Objective:  Physical Exam: warm, good capillary refill, no trophic changes or ulcerative lesions, normal DP and PT pulses, normal sensory exam, and tinea pedis. Left Foot: dystrophic yellowed discolored nail plates with subungual debris Right Foot: dystrophic yellowed discolored nail plates with subungual debris  Assessment:   1. Pain due to onychomycosis of toenails of both feet   2. Tinea pedis of both feet      Plan:  Patient was evaluated and treated and all questions answered.  Discussed the etiology and treatment options for the condition in detail with the patient. Educated patient on the topical and oral treatment options for mycotic nails. Recommended debridement of the nails today. Sharp and mechanical debridement performed of all painful and mycotic nails today. Nails debrided in length and thickness using a nail nipper to level of comfort. Discussed treatment options including appropriate shoe gear. Follow up as needed for painful nails.  Discussed the etiology and treatment options for tinea pedis.  Discussed topical and oral treatment.  Recommended topical treatment with 2% ketoconazole cream.  This was sent to the patient's pharmacy.  Also discussed appropriate foot hygiene, use of antifungal spray such as Tinactin in shoes, as well as cleaning her foot surfaces such as showers and bathroom floors with bleach.   Return in about 3 months (around 12/07/2021) for thick painful nails.

## 2021-09-13 ENCOUNTER — Encounter (HOSPITAL_COMMUNITY): Payer: Self-pay | Admitting: Gastroenterology

## 2021-09-20 ENCOUNTER — Other Ambulatory Visit: Payer: Self-pay

## 2021-09-20 ENCOUNTER — Ambulatory Visit (HOSPITAL_BASED_OUTPATIENT_CLINIC_OR_DEPARTMENT_OTHER): Payer: Medicare Other | Admitting: Anesthesiology

## 2021-09-20 ENCOUNTER — Ambulatory Visit (HOSPITAL_COMMUNITY): Payer: Medicare Other | Admitting: Anesthesiology

## 2021-09-20 ENCOUNTER — Ambulatory Visit (HOSPITAL_COMMUNITY)
Admission: RE | Admit: 2021-09-20 | Discharge: 2021-09-20 | Disposition: A | Discharge status: Home / Self Care | Payer: Medicare Other | Attending: Gastroenterology | Admitting: Gastroenterology

## 2021-09-20 ENCOUNTER — Encounter (HOSPITAL_COMMUNITY)
Admission: RE | Disposition: A | Discharge status: Home / Self Care | Payer: Self-pay | Source: Home / Self Care | Attending: Gastroenterology

## 2021-09-20 DIAGNOSIS — D124 Benign neoplasm of descending colon: Secondary | ICD-10-CM | POA: Diagnosis not present

## 2021-09-20 DIAGNOSIS — Z86010 Personal history of colonic polyps: Secondary | ICD-10-CM | POA: Diagnosis not present

## 2021-09-20 DIAGNOSIS — Z860101 Personal history of adenomatous and serrated colon polyps: Secondary | ICD-10-CM

## 2021-09-20 DIAGNOSIS — E119 Type 2 diabetes mellitus without complications: Secondary | ICD-10-CM | POA: Insufficient documentation

## 2021-09-20 DIAGNOSIS — K7460 Unspecified cirrhosis of liver: Secondary | ICD-10-CM | POA: Diagnosis present

## 2021-09-20 DIAGNOSIS — D122 Benign neoplasm of ascending colon: Secondary | ICD-10-CM | POA: Insufficient documentation

## 2021-09-20 DIAGNOSIS — Z1211 Encounter for screening for malignant neoplasm of colon: Secondary | ICD-10-CM | POA: Diagnosis not present

## 2021-09-20 DIAGNOSIS — Z6841 Body Mass Index (BMI) 40.0 and over, adult: Secondary | ICD-10-CM | POA: Insufficient documentation

## 2021-09-20 DIAGNOSIS — I10 Essential (primary) hypertension: Secondary | ICD-10-CM | POA: Insufficient documentation

## 2021-09-20 DIAGNOSIS — K2970 Gastritis, unspecified, without bleeding: Secondary | ICD-10-CM

## 2021-09-20 DIAGNOSIS — K640 First degree hemorrhoids: Secondary | ICD-10-CM

## 2021-09-20 DIAGNOSIS — Z79899 Other long term (current) drug therapy: Secondary | ICD-10-CM | POA: Diagnosis not present

## 2021-09-20 DIAGNOSIS — K635 Polyp of colon: Secondary | ICD-10-CM | POA: Diagnosis not present

## 2021-09-20 HISTORY — PX: ESOPHAGOGASTRODUODENOSCOPY (EGD) WITH PROPOFOL: SHX5813

## 2021-09-20 HISTORY — PX: POLYPECTOMY: SHX5525

## 2021-09-20 HISTORY — PX: COLONOSCOPY WITH PROPOFOL: SHX5780

## 2021-09-20 LAB — GLUCOSE, CAPILLARY: Glucose-Capillary: 111 mg/dL — ABNORMAL HIGH (ref 70–99)

## 2021-09-20 SURGERY — COLONOSCOPY WITH PROPOFOL
Anesthesia: Monitor Anesthesia Care

## 2021-09-20 MED ORDER — PROPOFOL 10 MG/ML IV BOLUS
INTRAVENOUS | Status: DC | PRN
  Administered 2021-09-20: 20 mg via INTRAVENOUS
  Administered 2021-09-20: 30 mg via INTRAVENOUS
  Administered 2021-09-20: 20 mg via INTRAVENOUS
  Administered 2021-09-20: 10 mg via INTRAVENOUS

## 2021-09-20 MED ORDER — LACTATED RINGERS IV SOLN: INTRAVENOUS | Status: DC

## 2021-09-20 MED ORDER — PHENYLEPHRINE 80 MCG/ML (10ML) SYRINGE FOR IV PUSH (FOR BLOOD PRESSURE SUPPORT)
PREFILLED_SYRINGE | INTRAVENOUS | Status: DC | PRN
  Administered 2021-09-20: 80 ug via INTRAVENOUS

## 2021-09-20 MED ORDER — LIDOCAINE 2% (20 MG/ML) 5 ML SYRINGE
INTRAMUSCULAR | Status: DC | PRN
  Administered 2021-09-20: 100 mg via INTRAVENOUS

## 2021-09-20 MED ORDER — PROPOFOL 1000 MG/100ML IV EMUL
INTRAVENOUS | Status: AC
  Filled 2021-09-20: qty 100

## 2021-09-20 MED ORDER — ONDANSETRON HCL 4 MG/2ML IJ SOLN
INTRAMUSCULAR | Status: DC | PRN
  Administered 2021-09-20: 4 mg via INTRAVENOUS

## 2021-09-20 MED ORDER — SODIUM CHLORIDE 0.9 % IV SOLN: INTRAVENOUS | Status: DC

## 2021-09-20 MED ORDER — PROPOFOL 10 MG/ML IV BOLUS
INTRAVENOUS | Status: AC
  Filled 2021-09-20: qty 20

## 2021-09-20 MED ORDER — PROPOFOL 500 MG/50ML IV EMUL
INTRAVENOUS | Status: DC | PRN
  Administered 2021-09-20: 125 ug/kg/min via INTRAVENOUS

## 2021-09-20 SURGICAL SUPPLY — 25 items

## 2021-09-20 NOTE — H&P (Signed)
Date of Initial H&P: 09/13/21  History reviewed, patient examined, no change in status, stable for surgery.

## 2021-09-20 NOTE — Anesthesia Postprocedure Evaluation (Signed)
Anesthesia Post Note  Patient: DAWOOD SPITLER  Procedure(s) Performed: COLONOSCOPY WITH PROPOFOL ESOPHAGOGASTRODUODENOSCOPY (EGD) WITH PROPOFOL POLYPECTOMY     Patient location during evaluation: PACU Anesthesia Type: MAC Level of consciousness: awake and alert Pain management: pain level controlled Vital Signs Assessment: post-procedure vital signs reviewed and stable Respiratory status: spontaneous breathing, nonlabored ventilation, respiratory function stable and patient connected to nasal cannula oxygen Cardiovascular status: stable and blood pressure returned to baseline Postop Assessment: no apparent nausea or vomiting Anesthetic complications: no   No notable events documented.  Last Vitals:  Vitals:   09/20/21 1320 09/20/21 1330  BP: (!) 143/77 (!) 159/130  Pulse: 63 61  Resp: 18 (!) 24  Temp:    SpO2: 96% 99%    Last Pain:  Vitals:   09/20/21 1330  TempSrc:   PainSc: 0-No pain                 Tiajuana Amass

## 2021-09-20 NOTE — Transfer of Care (Signed)
Immediate Anesthesia Transfer of Care Note  Patient: Nathaniel Burns  Procedure(s) Performed: COLONOSCOPY WITH PROPOFOL ESOPHAGOGASTRODUODENOSCOPY (EGD) WITH PROPOFOL POLYPECTOMY  Patient Location: Endoscopy Unit  Anesthesia Type:MAC  Level of Consciousness: drowsy and patient cooperative  Airway & Oxygen Therapy: Patient Spontanous Breathing and Patient connected to face mask oxygen  Post-op Assessment: Report given to RN and Post -op Vital signs reviewed and stable  Post vital signs: Reviewed and stable  Last Vitals:  Vitals Value Taken Time  BP 108/57 09/20/21 1302  Temp    Pulse 70 09/20/21 1303  Resp 26 09/20/21 1303  SpO2 98 % 09/20/21 1303  Vitals shown include unvalidated device data.  Last Pain:  Vitals:   09/20/21 1047  TempSrc: Temporal  PainSc: 0-No pain         Complications: No notable events documented.

## 2021-09-20 NOTE — Interval H&P Note (Signed)
History and Physical Interval Note:  09/20/2021 12:18 PM  Nathaniel Burns  has presented today for surgery, with the diagnosis of History of colon polyps and Cirrhosis.  The various methods of treatment have been discussed with the patient and family. After consideration of risks, benefits and other options for treatment, the patient has consented to  Procedure(s): COLONOSCOPY WITH PROPOFOL (N/A) ESOPHAGOGASTRODUODENOSCOPY (EGD) WITH PROPOFOL (N/A) as a surgical intervention.  The patient's history has been reviewed, patient examined, no change in status, stable for surgery.  I have reviewed the patient's chart and labs.  Questions were answered to the patient's satisfaction.     Lear Ng

## 2021-09-20 NOTE — Op Note (Signed)
[facility] Patient Name: Nathaniel Burns Procedure Date: 09/20/2021 MRN: [id number] Attending MD: Lear Ng , MD Date of Birth: [birth year] CSN: [id number] Age: 66 Admit Type: Outpatient Procedure:                Colonoscopy Indications:              High risk colon cancer surveillance: Personal                            history of colonic polyps, Last colonoscopy:                            October 2017 Providers:                Lear Ng, MD, Jaci Carrel, RN,                            William Dalton, Technician, Adair Laundry, CRNA Referring MD:             Lear Ng, MD Medicines:                Propofol per Anesthesia, Monitored Anesthesia Care Complications:            No immediate complications. Estimated Blood Loss:     Estimated blood loss was minimal. Procedure:                Pre-Anesthesia Assessment:                           - Prior to the procedure, a History and Physical                            was performed, and patient medications and                            allergies were reviewed. The patient's tolerance of                            previous anesthesia was also reviewed. The risks                            and benefits of the procedure and the sedation                            options and risks were discussed with the patient.                            All questions were answered, and informed consent                            was obtained. Prior Anticoagulants: The patient has                            taken no previous anticoagulant or antiplatelet  agents. ASA Grade Assessment: III - A patient with                            severe systemic disease. After reviewing the risks                            and benefits, the patient was deemed in                            satisfactory condition to undergo the procedure.                           After obtaining informed consent,  the colonoscope                            was passed under direct vision. Throughout the                            procedure, the patient's blood pressure, pulse, and                            oxygen saturations were monitored continuously. The                            [id number]-HQ190L ([id number]) Olympus colonoscope was                            introduced through the anus and advanced to the the                            cecum, identified by appendiceal orifice and                            ileocecal valve. The colonoscopy was performed                            without difficulty. The patient tolerated the                            procedure well. The quality of the bowel                            preparation was fair. The ileocecal valve,                            appendiceal orifice, and rectum were photographed. Scope In: 12:35:41 PM Scope Out: 12:53:48 PM Scope Withdrawal Time: 0 hours 11 minutes 46 seconds  Total Procedure Duration: 0 hours 18 minutes 7 seconds  Findings:      The perianal and digital rectal examinations were normal.      A 8 mm polyp was found in the descending colon. The polyp was sessile.       The polyp was removed with a hot snare. Resection and retrieval were       complete. Estimated blood loss:  none.      Two semi-sessile polyps were found in the proximal ascending colon. The       polyps were 4 to 6 mm in size. These polyps were removed with a hot       snare. Resection and retrieval were complete. Estimated blood loss was       minimal.      Internal hemorrhoids were found during retroflexion. The hemorrhoids       were medium-sized and Grade I (internal hemorrhoids that do not       prolapse). Impression:               - Preparation of the colon was fair.                           - One 8 mm polyp in the descending colon, removed                            with a hot snare. Resected and retrieved.                           - Two 4 to 6 mm polyps  in the proximal ascending                            colon, removed with a hot snare. Resected and                            retrieved.                           - Internal hemorrhoids. Moderate Sedation:      N/A - MAC procedure Recommendation:           - Patient has a contact number available for                            emergencies. The signs and symptoms of potential                            delayed complications were discussed with the                            patient. Return to normal activities tomorrow.                            Written discharge instructions were provided to the                            patient.                           - Low sodium diet.                           - Await pathology results.                           - Repeat colonoscopy  for surveillance based on                            pathology results. Procedure Code(s):        --- Professional ---                           [phone], Colonoscopy, flexible; with removal of                            tumor(s), polyp(s), or other lesion(s) by snare                            technique Diagnosis Code(s):        --- Professional ---                           Z86.010, Personal history of colonic polyps                           K63.5, Polyp of colon                           K64.0, First degree hemorrhoids CPT copyright 2019 [employer]. All rights reserved. The codes documented in this report are preliminary and upon coder review may  be revised to meet current compliance requirements. Lear Ng, MD 09/20/2021 1:06:12 PM This report has been signed electronically. Number of Addenda: 0

## 2021-09-20 NOTE — Anesthesia Preprocedure Evaluation (Signed)
Anesthesia Evaluation  Patient identified by MRN, date of birth, ID band Patient awake    Reviewed: Allergy & Precautions, NPO status , Patient's Chart, lab work & pertinent test results  Airway Mallampati: II  TM Distance: >3 FB Neck ROM: Full    Dental  (+) Dental Advisory Given   Pulmonary neg pulmonary ROS,    breath sounds clear to auscultation       Cardiovascular hypertension, Pt. on medications  Rhythm:Regular Rate:Normal     Neuro/Psych negative neurological ROS     GI/Hepatic negative GI ROS, Neg liver ROS,   Endo/Other  diabetes, Type 2Morbid obesity  Renal/GU negative Renal ROS     Musculoskeletal   Abdominal   Peds  Hematology negative hematology ROS (+)   Anesthesia Other Findings   Reproductive/Obstetrics                             Anesthesia Physical Anesthesia Plan  ASA: 3  Anesthesia Plan: MAC   Post-op Pain Management: Minimal or no pain anticipated   Induction:   PONV Risk Score and Plan: 1 and Propofol infusion, Ondansetron and Treatment may vary due to age or medical condition  Airway Management Planned: Natural Airway, Nasal Cannula and Simple Face Mask  Additional Equipment:   Intra-op Plan:   Post-operative Plan:   Informed Consent: I have reviewed the patients History and Physical, chart, labs and discussed the procedure including the risks, benefits and alternatives for the proposed anesthesia with the patient or authorized representative who has indicated his/her understanding and acceptance.       Plan Discussed with:   Anesthesia Plan Comments:         Anesthesia Quick Evaluation

## 2021-09-20 NOTE — Op Note (Signed)
[facility] Patient Name: Nathaniel Burns Procedure Date: 09/20/2021 MRN: [id number] Attending MD: Lear Ng , MD Date of Birth: 01-15-1956 CSN: [id number] Age: 66 Admit Type: Outpatient Procedure:                Upper GI endoscopy Indications:              Cirrhosis rule out esophageal varices Providers:                Lear Ng, MD, Jaci Carrel, RN,                            Luan Moore, Technician, Adair Laundry, CRNA Referring MD:             Lear Ng, MD Medicines:                Propofol per Anesthesia, Monitored Anesthesia Care Complications:            No immediate complications. Estimated Blood Loss:     Estimated blood loss: none. Procedure:                Pre-Anesthesia Assessment:                           - Prior to the procedure, a History and Physical                            was performed, and patient medications and                            allergies were reviewed. The patient's tolerance of                            previous anesthesia was also reviewed. The risks                            and benefits of the procedure and the sedation                            options and risks were discussed with the patient.                            All questions were answered, and informed consent                            was obtained. Prior Anticoagulants: The patient has                            taken no previous anticoagulant or antiplatelet                            agents. ASA Grade Assessment: III - A patient with                            severe systemic disease. After reviewing the risks  and benefits, the patient was deemed in                            satisfactory condition to undergo the procedure.                           After obtaining informed consent, the endoscope was                            passed under direct vision. Throughout the                             procedure, the patient's blood pressure, pulse, and                            oxygen saturations were monitored continuously. The                            [id number]-[id number] ([id number]) Olympus endoscope was introduced                            through the mouth, and advanced to the second part                            of duodenum. The upper GI endoscopy was                            accomplished without difficulty. The patient                            tolerated the procedure well. Scope In: Scope Out: Findings:      The examined esophagus was normal.      The Z-line was regular and was found 42 cm from the incisors.      Segmental mild inflammation characterized by congestion (edema) and       erythema was found in the gastric antrum.      The cardia and gastric fundus were normal on retroflexion.      The examined duodenum was normal. Impression:               - Normal esophagus.                           - Z-line regular, 42 cm from the incisors.                           - Gastritis.                           - Normal examined duodenum.                           - No specimens collected. Moderate Sedation:      N/A - MAC procedure Recommendation:           - Patient has a contact number available for  emergencies. The signs and symptoms of potential                            delayed complications were discussed with the                            patient. Return to normal activities tomorrow.                            Written discharge instructions were provided to the                            patient.                           - Low sodium diet. Procedure Code(s):        --- Professional ---                           [phone], Esophagogastroduodenoscopy, flexible,                            transoral; diagnostic, including collection of                            specimen(s) by brushing or washing, when performed                            (separate  procedure) Diagnosis Code(s):        --- Professional ---                           K74.60, Unspecified cirrhosis of liver                           K29.70, Gastritis, unspecified, without bleeding CPT copyright 2019 American Medical Association. All rights reserved. The codes documented in this report are preliminary and upon coder review may  be revised to meet current compliance requirements. Lear Ng, MD 09/20/2021 1:01:12 PM This report has been signed electronically. Number of Addenda: 0

## 2021-09-20 NOTE — Discharge Instructions (Signed)

## 2021-09-21 ENCOUNTER — Encounter (HOSPITAL_COMMUNITY): Payer: Self-pay | Admitting: Gastroenterology

## 2021-09-21 LAB — SURGICAL PATHOLOGY

## 2021-11-12 DIAGNOSIS — E1140 Type 2 diabetes mellitus with diabetic neuropathy, unspecified: Secondary | ICD-10-CM | POA: Diagnosis not present

## 2021-11-12 DIAGNOSIS — E785 Hyperlipidemia, unspecified: Secondary | ICD-10-CM | POA: Diagnosis not present

## 2021-11-12 DIAGNOSIS — Z23 Encounter for immunization: Secondary | ICD-10-CM | POA: Diagnosis not present

## 2021-11-12 DIAGNOSIS — I10 Essential (primary) hypertension: Secondary | ICD-10-CM | POA: Diagnosis not present

## 2021-12-05 ENCOUNTER — Other Ambulatory Visit: Payer: Self-pay | Admitting: Nurse Practitioner

## 2021-12-05 DIAGNOSIS — K760 Fatty (change of) liver, not elsewhere classified: Secondary | ICD-10-CM | POA: Diagnosis not present

## 2021-12-05 DIAGNOSIS — K7469 Other cirrhosis of liver: Secondary | ICD-10-CM | POA: Diagnosis not present

## 2021-12-10 ENCOUNTER — Ambulatory Visit
Admission: RE | Admit: 2021-12-10 | Discharge: 2021-12-10 | Disposition: A | Discharge status: Home / Self Care | Payer: Medicare Other | Source: Ambulatory Visit | Attending: Nurse Practitioner | Admitting: Nurse Practitioner

## 2021-12-10 DIAGNOSIS — K7460 Unspecified cirrhosis of liver: Secondary | ICD-10-CM | POA: Diagnosis not present

## 2021-12-10 DIAGNOSIS — K8020 Calculus of gallbladder without cholecystitis without obstruction: Secondary | ICD-10-CM | POA: Diagnosis not present

## 2021-12-10 DIAGNOSIS — K7469 Other cirrhosis of liver: Secondary | ICD-10-CM

## 2021-12-13 ENCOUNTER — Ambulatory Visit (INDEPENDENT_AMBULATORY_CARE_PROVIDER_SITE_OTHER): Payer: Medicare Other | Admitting: Podiatry

## 2021-12-13 DIAGNOSIS — Z91199 Patient's noncompliance with other medical treatment and regimen due to unspecified reason: Secondary | ICD-10-CM

## 2021-12-13 NOTE — Progress Notes (Signed)
Patient was no-show for appointment today 

## 2022-01-01 DIAGNOSIS — E7800 Pure hypercholesterolemia, unspecified: Secondary | ICD-10-CM | POA: Diagnosis not present

## 2022-06-07 DIAGNOSIS — R39198 Other difficulties with micturition: Secondary | ICD-10-CM | POA: Diagnosis not present

## 2022-06-07 DIAGNOSIS — E7800 Pure hypercholesterolemia, unspecified: Secondary | ICD-10-CM | POA: Diagnosis not present

## 2022-06-07 DIAGNOSIS — E1140 Type 2 diabetes mellitus with diabetic neuropathy, unspecified: Secondary | ICD-10-CM | POA: Diagnosis not present

## 2022-06-07 DIAGNOSIS — I10 Essential (primary) hypertension: Secondary | ICD-10-CM | POA: Diagnosis not present

## 2022-06-07 DIAGNOSIS — Z5320 Procedure and treatment not carried out because of patient's decision for unspecified reasons: Secondary | ICD-10-CM | POA: Diagnosis not present

## 2022-07-03 DIAGNOSIS — M25562 Pain in left knee: Secondary | ICD-10-CM | POA: Diagnosis not present

## 2022-07-09 DIAGNOSIS — M1712 Unilateral primary osteoarthritis, left knee: Secondary | ICD-10-CM | POA: Diagnosis not present

## 2022-08-07 DIAGNOSIS — M1712 Unilateral primary osteoarthritis, left knee: Secondary | ICD-10-CM | POA: Diagnosis not present

## 2022-11-29 DIAGNOSIS — M1712 Unilateral primary osteoarthritis, left knee: Secondary | ICD-10-CM | POA: Diagnosis not present

## 2022-12-06 DIAGNOSIS — M1712 Unilateral primary osteoarthritis, left knee: Secondary | ICD-10-CM | POA: Diagnosis not present

## 2022-12-09 DIAGNOSIS — C61 Malignant neoplasm of prostate: Secondary | ICD-10-CM | POA: Diagnosis not present

## 2022-12-10 DIAGNOSIS — E1140 Type 2 diabetes mellitus with diabetic neuropathy, unspecified: Secondary | ICD-10-CM | POA: Diagnosis not present

## 2022-12-10 DIAGNOSIS — Z87820 Personal history of traumatic brain injury: Secondary | ICD-10-CM | POA: Diagnosis not present

## 2022-12-10 DIAGNOSIS — E1165 Type 2 diabetes mellitus with hyperglycemia: Secondary | ICD-10-CM | POA: Diagnosis not present

## 2022-12-10 DIAGNOSIS — H6123 Impacted cerumen, bilateral: Secondary | ICD-10-CM | POA: Diagnosis not present

## 2022-12-10 DIAGNOSIS — I10 Essential (primary) hypertension: Secondary | ICD-10-CM | POA: Diagnosis not present

## 2022-12-10 DIAGNOSIS — B182 Chronic viral hepatitis C: Secondary | ICD-10-CM | POA: Diagnosis not present

## 2022-12-10 DIAGNOSIS — Z0000 Encounter for general adult medical examination without abnormal findings: Secondary | ICD-10-CM | POA: Diagnosis not present

## 2022-12-10 DIAGNOSIS — Z23 Encounter for immunization: Secondary | ICD-10-CM | POA: Diagnosis not present

## 2022-12-10 DIAGNOSIS — K760 Fatty (change of) liver, not elsewhere classified: Secondary | ICD-10-CM | POA: Diagnosis not present

## 2022-12-10 DIAGNOSIS — E7800 Pure hypercholesterolemia, unspecified: Secondary | ICD-10-CM | POA: Diagnosis not present

## 2022-12-10 DIAGNOSIS — H35033 Hypertensive retinopathy, bilateral: Secondary | ICD-10-CM | POA: Diagnosis not present

## 2022-12-13 DIAGNOSIS — M1712 Unilateral primary osteoarthritis, left knee: Secondary | ICD-10-CM | POA: Diagnosis not present

## 2022-12-24 DIAGNOSIS — C61 Malignant neoplasm of prostate: Secondary | ICD-10-CM | POA: Diagnosis not present

## 2022-12-24 DIAGNOSIS — C775 Secondary and unspecified malignant neoplasm of intrapelvic lymph nodes: Secondary | ICD-10-CM | POA: Diagnosis not present

## 2022-12-24 DIAGNOSIS — Z9079 Acquired absence of other genital organ(s): Secondary | ICD-10-CM | POA: Diagnosis not present

## 2022-12-24 DIAGNOSIS — K8020 Calculus of gallbladder without cholecystitis without obstruction: Secondary | ICD-10-CM | POA: Diagnosis not present

## 2022-12-26 ENCOUNTER — Other Ambulatory Visit: Payer: Self-pay | Admitting: Nurse Practitioner

## 2022-12-26 DIAGNOSIS — K7469 Other cirrhosis of liver: Secondary | ICD-10-CM

## 2022-12-26 DIAGNOSIS — K7581 Nonalcoholic steatohepatitis (NASH): Secondary | ICD-10-CM | POA: Diagnosis not present

## 2023-01-02 ENCOUNTER — Ambulatory Visit
Admission: RE | Admit: 2023-01-02 | Discharge: 2023-01-02 | Disposition: A | Discharge status: Home / Self Care | Payer: Medicare Other | Source: Ambulatory Visit | Attending: Nurse Practitioner | Admitting: Nurse Practitioner

## 2023-01-02 DIAGNOSIS — K8020 Calculus of gallbladder without cholecystitis without obstruction: Secondary | ICD-10-CM | POA: Diagnosis not present

## 2023-01-02 DIAGNOSIS — K7581 Nonalcoholic steatohepatitis (NASH): Secondary | ICD-10-CM

## 2023-01-02 DIAGNOSIS — K7469 Other cirrhosis of liver: Secondary | ICD-10-CM

## 2023-01-02 DIAGNOSIS — K7460 Unspecified cirrhosis of liver: Secondary | ICD-10-CM | POA: Diagnosis not present

## 2023-01-13 DIAGNOSIS — C61 Malignant neoplasm of prostate: Secondary | ICD-10-CM | POA: Diagnosis not present

## 2023-01-29 DIAGNOSIS — C61 Malignant neoplasm of prostate: Secondary | ICD-10-CM | POA: Diagnosis not present

## 2023-04-09 ENCOUNTER — Other Ambulatory Visit: Payer: Self-pay | Admitting: Nurse Practitioner

## 2023-04-09 DIAGNOSIS — K7581 Nonalcoholic steatohepatitis (NASH): Secondary | ICD-10-CM

## 2023-04-09 DIAGNOSIS — K7469 Other cirrhosis of liver: Secondary | ICD-10-CM | POA: Diagnosis not present

## 2023-05-21 DIAGNOSIS — C61 Malignant neoplasm of prostate: Secondary | ICD-10-CM | POA: Diagnosis not present

## 2023-06-10 DIAGNOSIS — E1140 Type 2 diabetes mellitus with diabetic neuropathy, unspecified: Secondary | ICD-10-CM | POA: Diagnosis not present

## 2023-06-10 DIAGNOSIS — H35033 Hypertensive retinopathy, bilateral: Secondary | ICD-10-CM | POA: Diagnosis not present

## 2023-06-10 DIAGNOSIS — I10 Essential (primary) hypertension: Secondary | ICD-10-CM | POA: Diagnosis not present

## 2023-06-10 DIAGNOSIS — K760 Fatty (change of) liver, not elsewhere classified: Secondary | ICD-10-CM | POA: Diagnosis not present

## 2023-06-10 DIAGNOSIS — B182 Chronic viral hepatitis C: Secondary | ICD-10-CM | POA: Diagnosis not present

## 2023-06-10 DIAGNOSIS — E7800 Pure hypercholesterolemia, unspecified: Secondary | ICD-10-CM | POA: Diagnosis not present

## 2023-06-10 DIAGNOSIS — Z87820 Personal history of traumatic brain injury: Secondary | ICD-10-CM | POA: Diagnosis not present

## 2023-06-10 DIAGNOSIS — C61 Malignant neoplasm of prostate: Secondary | ICD-10-CM | POA: Diagnosis not present

## 2023-06-10 DIAGNOSIS — E1165 Type 2 diabetes mellitus with hyperglycemia: Secondary | ICD-10-CM | POA: Diagnosis not present

## 2023-07-04 ENCOUNTER — Ambulatory Visit
Admission: RE | Admit: 2023-07-04 | Discharge: 2023-07-04 | Disposition: A | Discharge status: Home / Self Care | Source: Ambulatory Visit | Attending: Nurse Practitioner | Admitting: Nurse Practitioner

## 2023-07-04 DIAGNOSIS — K769 Liver disease, unspecified: Secondary | ICD-10-CM | POA: Diagnosis not present

## 2023-07-04 DIAGNOSIS — K7581 Nonalcoholic steatohepatitis (NASH): Secondary | ICD-10-CM

## 2023-07-04 DIAGNOSIS — K7469 Other cirrhosis of liver: Secondary | ICD-10-CM

## 2023-07-04 DIAGNOSIS — K8020 Calculus of gallbladder without cholecystitis without obstruction: Secondary | ICD-10-CM | POA: Diagnosis not present

## 2023-07-31 IMAGING — US US ABDOMEN LIMITED
1 series · 14 of 25 positions shown · non-contrast
Comparison: Ultrasound January 02, 2021

CLINICAL DATA: Cirrhosis

EXAM:
ULTRASOUND ABDOMEN LIMITED RIGHT UPPER QUADRANT

[Series 1: us abdomen limited · 0.21mm/px · 14 of 41 slices shown]
[im 1/41]
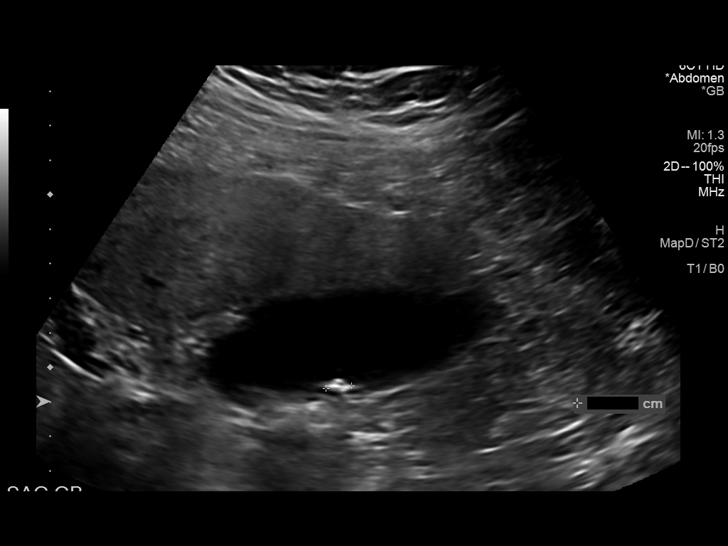
[im 4/41]
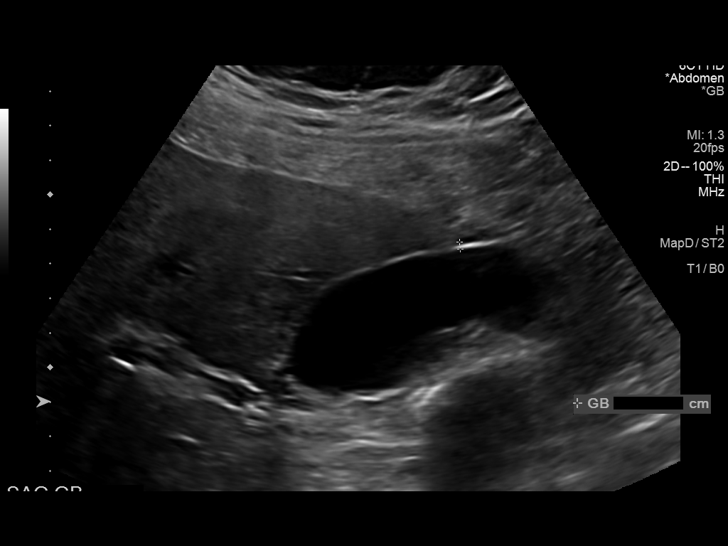
[im 7/41]
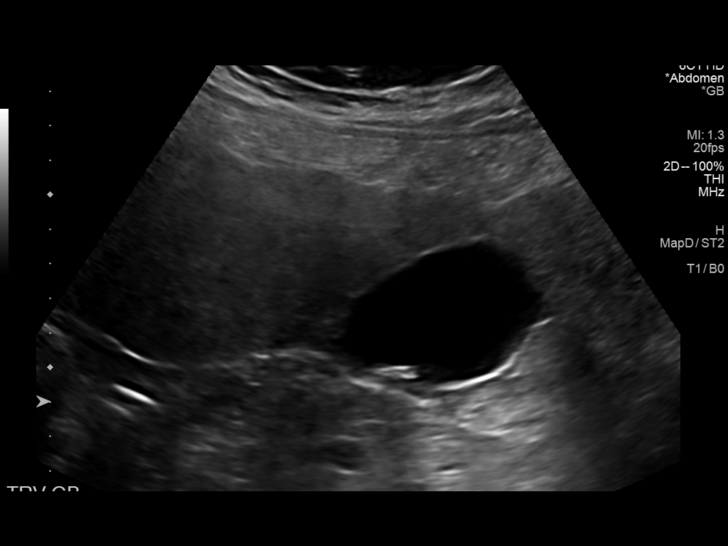
[im 11/41]
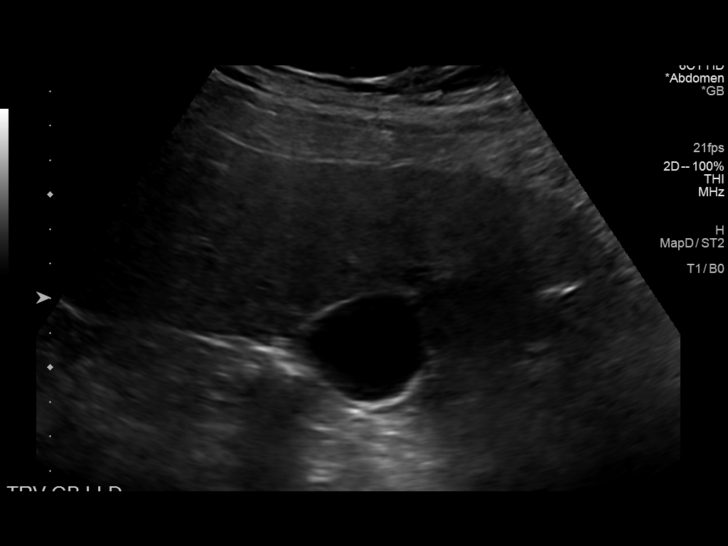
[im 14/41]
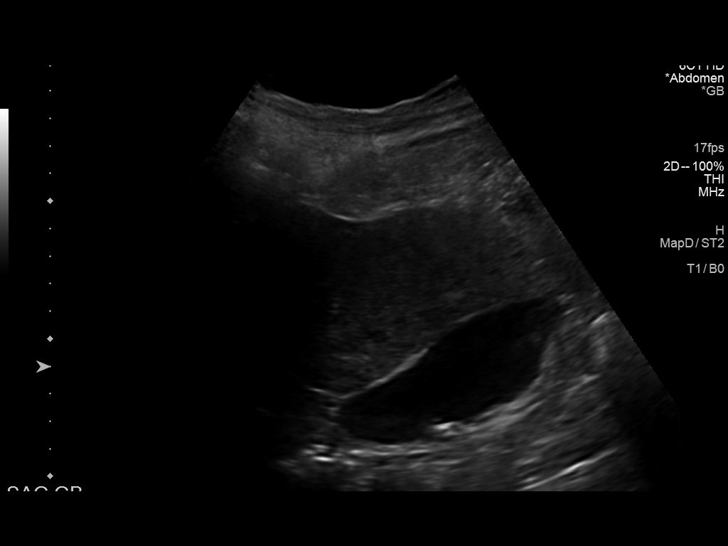
[im 16/41]
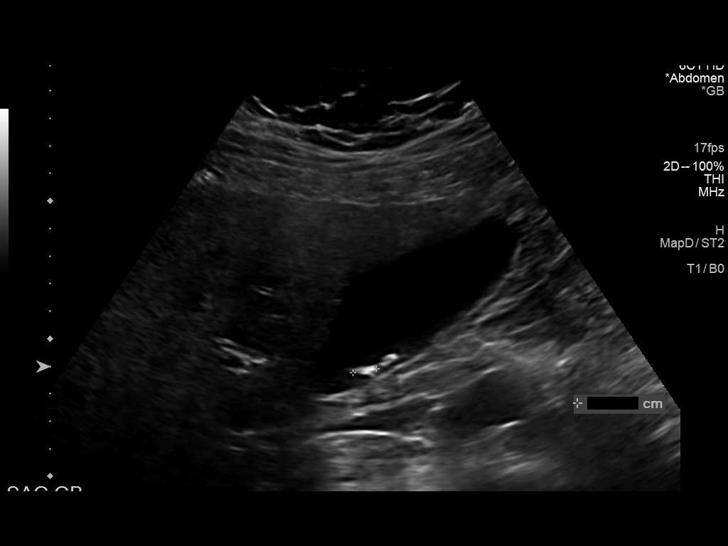
[im 19/41]
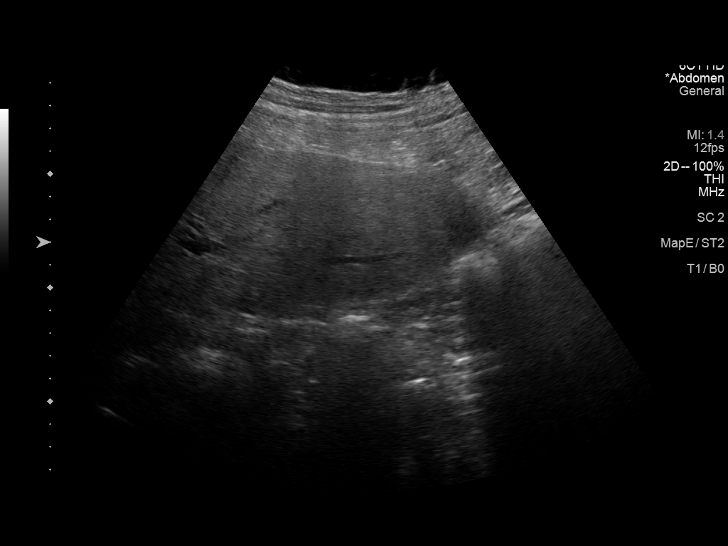
[im 22/41]
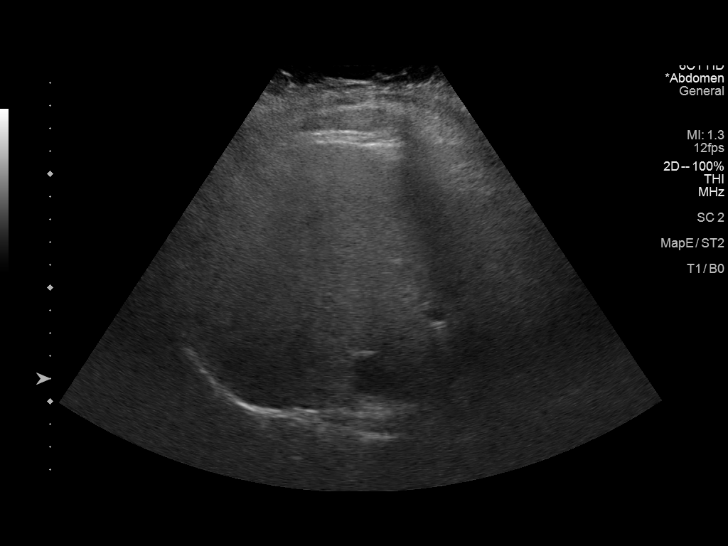
[im 26/41]
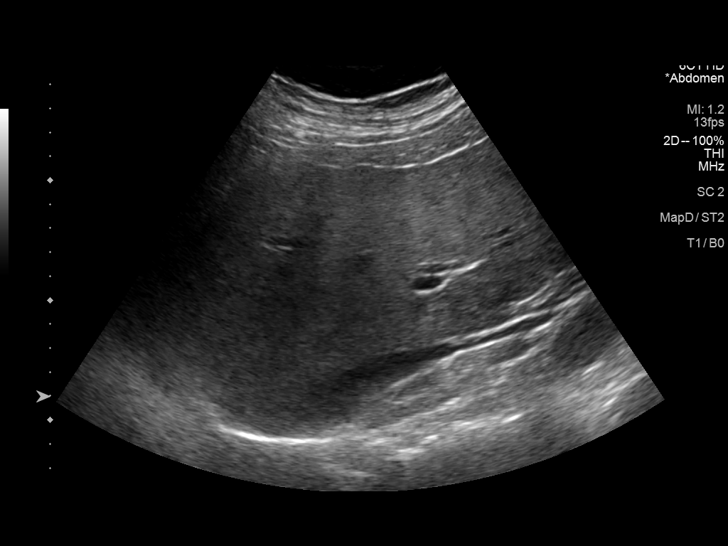
[im 27/41]
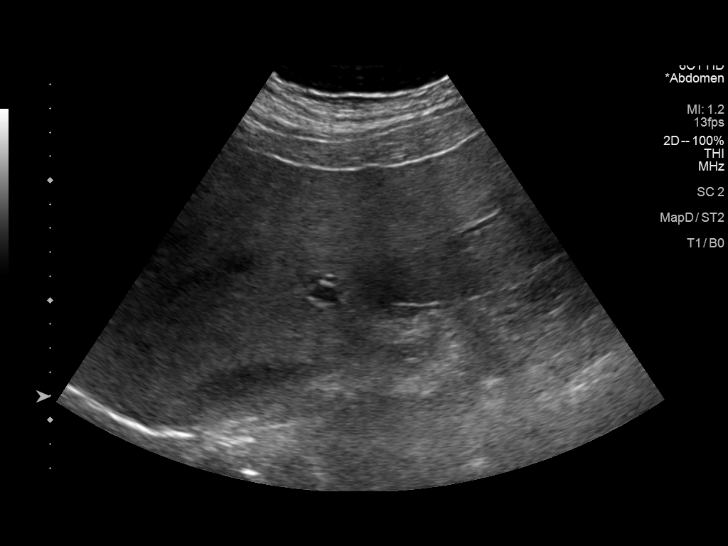
[im 31/41]
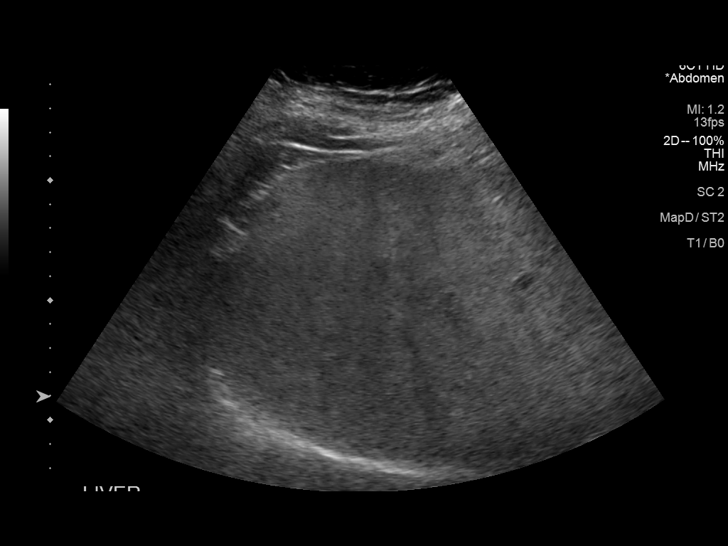
[im 34/41]
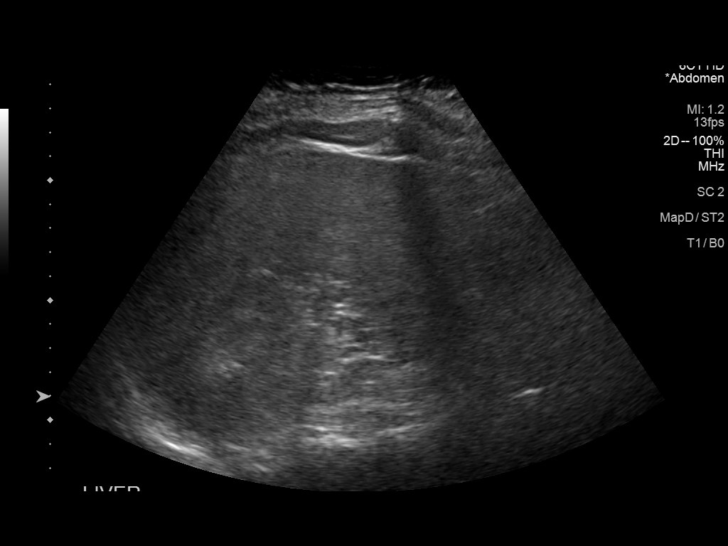
[im 37/41]
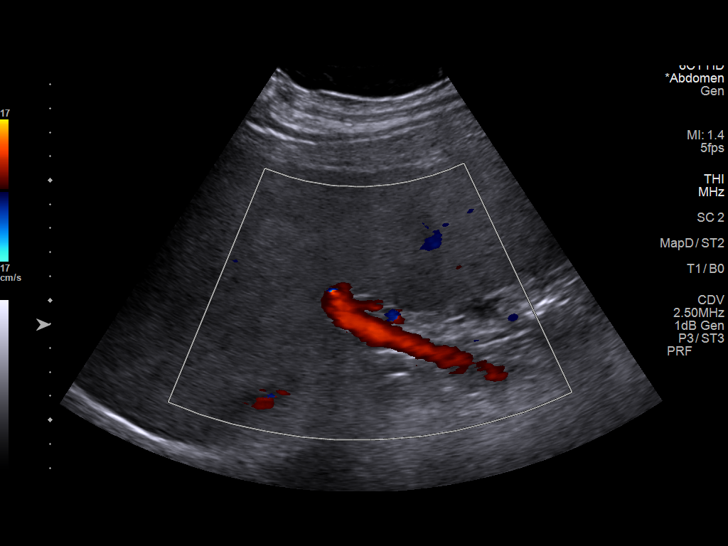
[im 41/41]
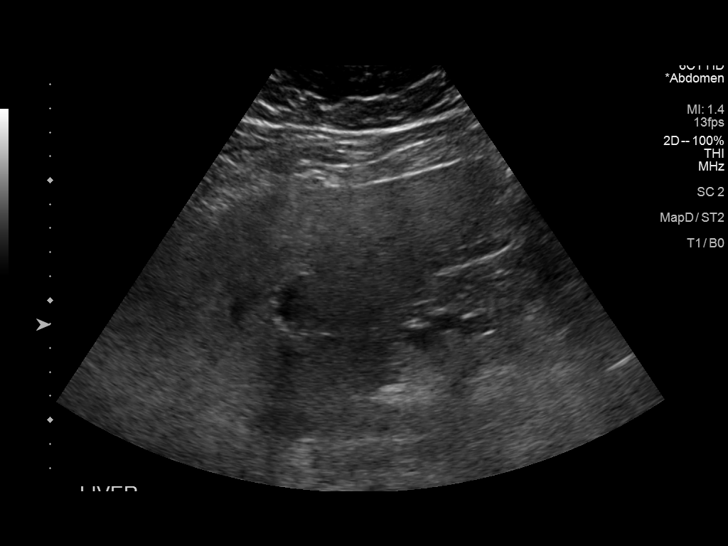

[14 of 25 positions shown; findings below may reference images not displayed]

FINDINGS: Gallbladder:

At least 2 small mobile stones are identified in the gallbladder.
The gallbladder is otherwise normal in appearance.

Common bile duct:

Diameter: 3.4 mm

Liver:

Heterogeneous echotexture. No focal mass. Portal vein is patent on
color Doppler imaging with normal direction of blood flow towards
the liver.

Other: None.
IMPRESSION: 1. Cholelithiasis in an otherwise normal appearing gallbladder.
2. The heterogeneous echotexture of the liver parenchyma is likely
due to reported cirrhosis. No mass identified.
3. No other abnormalities.

## 2023-09-17 DIAGNOSIS — C61 Malignant neoplasm of prostate: Secondary | ICD-10-CM | POA: Diagnosis not present

## 2023-12-22 DIAGNOSIS — H35033 Hypertensive retinopathy, bilateral: Secondary | ICD-10-CM | POA: Diagnosis not present

## 2023-12-22 DIAGNOSIS — I10 Essential (primary) hypertension: Secondary | ICD-10-CM | POA: Diagnosis not present

## 2023-12-22 DIAGNOSIS — E1140 Type 2 diabetes mellitus with diabetic neuropathy, unspecified: Secondary | ICD-10-CM | POA: Diagnosis not present

## 2023-12-22 DIAGNOSIS — Z23 Encounter for immunization: Secondary | ICD-10-CM | POA: Diagnosis not present

## 2023-12-22 DIAGNOSIS — Z87820 Personal history of traumatic brain injury: Secondary | ICD-10-CM | POA: Diagnosis not present

## 2023-12-22 DIAGNOSIS — E7800 Pure hypercholesterolemia, unspecified: Secondary | ICD-10-CM | POA: Diagnosis not present

## 2023-12-22 DIAGNOSIS — Z0000 Encounter for general adult medical examination without abnormal findings: Secondary | ICD-10-CM | POA: Diagnosis not present

## 2023-12-22 DIAGNOSIS — K760 Fatty (change of) liver, not elsewhere classified: Secondary | ICD-10-CM | POA: Diagnosis not present

## 2023-12-22 DIAGNOSIS — H6121 Impacted cerumen, right ear: Secondary | ICD-10-CM | POA: Diagnosis not present

## 2023-12-22 DIAGNOSIS — B182 Chronic viral hepatitis C: Secondary | ICD-10-CM | POA: Diagnosis not present

## 2023-12-22 DIAGNOSIS — E1165 Type 2 diabetes mellitus with hyperglycemia: Secondary | ICD-10-CM | POA: Diagnosis not present

## 2023-12-29 ENCOUNTER — Other Ambulatory Visit: Payer: Self-pay | Admitting: Nurse Practitioner

## 2023-12-29 DIAGNOSIS — C61 Malignant neoplasm of prostate: Secondary | ICD-10-CM | POA: Diagnosis not present

## 2023-12-29 DIAGNOSIS — K7581 Nonalcoholic steatohepatitis (NASH): Secondary | ICD-10-CM | POA: Diagnosis not present

## 2023-12-29 DIAGNOSIS — K7469 Other cirrhosis of liver: Secondary | ICD-10-CM

## 2023-12-29 DIAGNOSIS — K766 Portal hypertension: Secondary | ICD-10-CM | POA: Diagnosis not present

## 2023-12-29 DIAGNOSIS — K8020 Calculus of gallbladder without cholecystitis without obstruction: Secondary | ICD-10-CM | POA: Diagnosis not present

## 2023-12-31 DIAGNOSIS — C61 Malignant neoplasm of prostate: Secondary | ICD-10-CM | POA: Diagnosis not present

## 2024-01-07 ENCOUNTER — Ambulatory Visit
Admission: RE | Admit: 2024-01-07 | Discharge: 2024-01-07 | Disposition: A | Discharge status: Home / Self Care | Source: Ambulatory Visit | Attending: Nurse Practitioner | Admitting: Nurse Practitioner

## 2024-01-07 DIAGNOSIS — K7469 Other cirrhosis of liver: Secondary | ICD-10-CM

## 2024-01-07 DIAGNOSIS — K7460 Unspecified cirrhosis of liver: Secondary | ICD-10-CM | POA: Diagnosis not present

## 2024-02-11 ENCOUNTER — Ambulatory Visit: Admitting: Podiatry
